# Patient Record
Sex: Female | Born: 1965 | Hispanic: Yes | Marital: Single | Smoking: Current some day smoker
Health system: Southern US, Community
[De-identification: ages and names within clinical notes are randomized; demographics above are authoritative.]

## PROBLEM LIST (undated history)

## (undated) DIAGNOSIS — J45909 Unspecified asthma, uncomplicated: Secondary | ICD-10-CM

## (undated) DIAGNOSIS — F319 Bipolar disorder, unspecified: Secondary | ICD-10-CM

## (undated) DIAGNOSIS — I10 Essential (primary) hypertension: Secondary | ICD-10-CM

## (undated) HISTORY — PX: BREAST EXCISIONAL BIOPSY: SUR124

---

## 2015-03-11 ENCOUNTER — Emergency Department
Admission: EM | Admit: 2015-03-11 | Discharge: 2015-03-11 | Disposition: A | Discharge status: Home / Self Care | Payer: Medicaid Other | Attending: Emergency Medicine | Admitting: Emergency Medicine

## 2015-03-11 ENCOUNTER — Encounter: Payer: Self-pay | Admitting: Emergency Medicine

## 2015-03-11 DIAGNOSIS — I10 Essential (primary) hypertension: Secondary | ICD-10-CM | POA: Insufficient documentation

## 2015-03-11 DIAGNOSIS — H6092 Unspecified otitis externa, left ear: Secondary | ICD-10-CM | POA: Diagnosis not present

## 2015-03-11 DIAGNOSIS — F17200 Nicotine dependence, unspecified, uncomplicated: Secondary | ICD-10-CM | POA: Diagnosis not present

## 2015-03-11 DIAGNOSIS — E119 Type 2 diabetes mellitus without complications: Secondary | ICD-10-CM | POA: Diagnosis not present

## 2015-03-11 DIAGNOSIS — J0100 Acute maxillary sinusitis, unspecified: Secondary | ICD-10-CM | POA: Diagnosis not present

## 2015-03-11 DIAGNOSIS — R51 Headache: Secondary | ICD-10-CM | POA: Diagnosis present

## 2015-03-11 DIAGNOSIS — H65192 Other acute nonsuppurative otitis media, left ear: Secondary | ICD-10-CM

## 2015-03-11 DIAGNOSIS — H65112 Acute and subacute allergic otitis media (mucoid) (sanguinous) (serous), left ear: Secondary | ICD-10-CM

## 2015-03-11 HISTORY — DX: Bipolar disorder, unspecified: F31.9

## 2015-03-11 HISTORY — DX: Unspecified asthma, uncomplicated: J45.909

## 2015-03-11 HISTORY — DX: Essential (primary) hypertension: I10

## 2015-03-11 MED ORDER — NEOMYCIN-POLYMYXIN-HC 3.5-10000-1 OT SOLN: 3.0000 [drp] | Freq: Three times a day (TID) | OTIC | Status: AC

## 2015-03-11 MED ORDER — CETIRIZINE HCL 10 MG PO TABS: 10.0000 mg | ORAL_TABLET | Freq: Every day | ORAL | Status: DC

## 2015-03-11 MED ORDER — FLUTICASONE PROPIONATE 50 MCG/ACT NA SUSP: 1.0000 | Freq: Two times a day (BID) | NASAL | Status: DC

## 2015-03-11 MED ORDER — AMOXICILLIN-POT CLAVULANATE 875-125 MG PO TABS: 1.0000 | ORAL_TABLET | Freq: Two times a day (BID) | ORAL | Status: DC

## 2015-03-11 NOTE — Discharge Instructions (Signed)
Ear Drops, Adult °You have been diagnosed with a condition requiring you to put drops of medicine into your outer ear. °HOME CARE INSTRUCTIONS  °· Put drops in the affected ear as instructed. After putting the drops in, you will need to lie down with the affected ear facing up for ten minutes so the drops will remain in the ear canal and run down and fill the canal. Continue using the ear drops for as long as directed by your health care provider. °· Prior to getting up, put a cotton ball gently in your ear canal. Leave enough of the cotton ball out so it can be easily removed. Do not attempt to push this down into the canal with a cotton-tipped swab or other instrument. °· Do not irrigate or wash out your ears if you have had a perforated eardrum or mastoid surgery, or unless instructed to do so by your health care provider. °· Keep appointments with your health care provider as instructed. °· Finish all medicine, or use for the length of time prescribed by your health care provider. Continue the drops even if your problem seems to be doing well after a couple days, or continue as instructed. °SEEK MEDICAL CARE IF: °· You become worse or develop increasing pain. °· You notice any unusual drainage from your ear (particularly if the drainage has a bad smell). °· You develop hearing difficulties. °· You experience a serious form of dizziness in which you feel as if the room is spinning, and you feel nauseated (vertigo). °· The outside of your ear becomes red or swollen or both. This may be a sign of an allergic reaction. °MAKE SURE YOU:  °· Understand these instructions. °· Will watch your condition. °· Will get help right away if you are not doing well or get worse. °  °This information is not intended to replace advice given to you by your health care provider. Make sure you discuss any questions you have with your health care provider. °  °Document Released: 01/31/2001 Document Revised: 02/27/2014 Document  Reviewed: 09/03/2012 °Elsevier Interactive Patient Education ©2016 Elsevier Inc. ° °Otitis Externa °Otitis externa is a bacterial or fungal infection of the outer ear canal. This is the area from the eardrum to the outside of the ear. Otitis externa is sometimes called "swimmer's ear." °CAUSES  °Possible causes of infection include: °· Swimming in dirty water. °· Moisture remaining in the ear after swimming or bathing. °· Mild injury (trauma) to the ear. °· Objects stuck in the ear (foreign body). °· Cuts or scrapes (abrasions) on the outside of the ear. °SIGNS AND SYMPTOMS  °The first symptom of infection is often itching in the ear canal. Later signs and symptoms may include swelling and redness of the ear canal, ear pain, and yellowish-white fluid (pus) coming from the ear. The ear pain may be worse when pulling on the earlobe. °DIAGNOSIS  °Your health care provider will perform a physical exam. A sample of fluid may be taken from the ear and examined for bacteria or fungi. °TREATMENT  °Antibiotic ear drops are often given for 10 to 14 days. Treatment may also include pain medicine or corticosteroids to reduce itching and swelling. °HOME CARE INSTRUCTIONS  °· Apply antibiotic ear drops to the ear canal as prescribed by your health care provider. °· Take medicines only as directed by your health care provider. °· If you have diabetes, follow any additional treatment instructions from your health care provider. °· Keep all follow-up visits   as directed by your health care provider. PREVENTION   Keep your ear dry. Use the corner of a towel to absorb water out of the ear canal after swimming or bathing.  Avoid scratching or putting objects inside your ear. This can damage the ear canal or remove the protective wax that lines the canal. This makes it easier for bacteria and fungi to grow.  Avoid swimming in lakes, polluted water, or poorly chlorinated pools.  You may use ear drops made of rubbing alcohol and  vinegar after swimming. Combine equal parts of white vinegar and alcohol in a bottle. Put 3 or 4 drops into each ear after swimming. SEEK MEDICAL CARE IF:   You have a fever.  Your ear is still red, swollen, painful, or draining pus after 3 days.  Your redness, swelling, or pain gets worse.  You have a severe headache.  You have redness, swelling, pain, or tenderness in the area behind your ear. MAKE SURE YOU:   Understand these instructions.  Will watch your condition.  Will get help right away if you are not doing well or get worse.   This information is not intended to replace advice given to you by your health care provider. Make sure you discuss any questions you have with your health care provider.   Document Released: 02/06/2005 Document Revised: 02/27/2014 Document Reviewed: 02/23/2011 Elsevier Interactive Patient Education 2016 [employer].  Otitis Media, Adult Otitis media is redness, soreness, and inflammation of the middle ear. Otitis media may be caused by allergies or, most commonly, by infection. Often it occurs as a complication of the common cold. SIGNS AND SYMPTOMS Symptoms of otitis media may include:  Earache.  Fever.  Ringing in your ear.  Headache.  Leakage of fluid from the ear. DIAGNOSIS To diagnose otitis media, your health care provider will examine your ear with an otoscope. This is an instrument that allows your health care provider to see into your ear in order to examine your eardrum. Your health care provider also will ask you questions about your symptoms. TREATMENT  Typically, otitis media resolves on its own within 3-5 days. Your health care provider may prescribe medicine to ease your symptoms of pain. If otitis media does not resolve within 5 days or is recurrent, your health care provider may prescribe antibiotic medicines if he or she suspects that a bacterial infection is the cause. HOME CARE INSTRUCTIONS   If you were prescribed  an antibiotic medicine, finish it all even if you start to feel better.  Take medicines only as directed by your health care provider.  Keep all follow-up visits as directed by your health care provider. SEEK MEDICAL CARE IF:  You have otitis media only in one ear, or bleeding from your nose, or both.  You notice a lump on your neck.  You are not getting better in 3-5 days.  You feel worse instead of better. SEEK IMMEDIATE MEDICAL CARE IF:   You have pain that is not controlled with medicine.  You have swelling, redness, or pain around your ear or stiffness in your neck.  You notice that part of your face is paralyzed.  You notice that the bone behind your ear (mastoid) is tender when you touch it. MAKE SURE YOU:   Understand these instructions.  Will watch your condition.  Will get help right away if you are not doing well or get worse.   This information is not intended to replace advice given to you  by your health care provider. Make sure you discuss any questions you have with your health care provider.   Document Released: 11/12/2003 Document Revised: 02/27/2014 Document Reviewed: 09/03/2012 Elsevier Interactive Patient Education 2016 Elsevier Inc.  Sinusitis, Adult Sinusitis is redness, soreness, and inflammation of the paranasal sinuses. Paranasal sinuses are air pockets within the bones of your face. They are located beneath your eyes, in the middle of your forehead, and above your eyes. In healthy paranasal sinuses, mucus is able to drain out, and air is able to circulate through them by way of your nose. However, when your paranasal sinuses are inflamed, mucus and air can become trapped. This can allow bacteria and other germs to grow and cause infection. Sinusitis can develop quickly and last only a short time (acute) or continue over a long period (chronic). Sinusitis that lasts for more than 12 weeks is considered chronic. CAUSES Causes of sinusitis  include:  Allergies.  Structural abnormalities, such as displacement of the cartilage that separates your nostrils (deviated septum), which can decrease the air flow through your nose and sinuses and affect sinus drainage.  Functional abnormalities, such as when the small hairs (cilia) that line your sinuses and help remove mucus do not work properly or are not present. SIGNS AND SYMPTOMS Symptoms of acute and chronic sinusitis are the same. The primary symptoms are pain and pressure around the affected sinuses. Other symptoms include:  Upper toothache.  Earache.  Headache.  Bad breath.  Decreased sense of smell and taste.  A cough, which worsens when you are lying flat.  Fatigue.  Fever.  Thick drainage from your nose, which often is green and may contain pus (purulent).  Swelling and warmth over the affected sinuses. DIAGNOSIS Your health care provider will perform a physical exam. During your exam, your health care provider may perform any of the following to help determine if you have acute sinusitis or chronic sinusitis:  Look in your nose for signs of abnormal growths in your nostrils (nasal polyps).  Tap over the affected sinus to check for signs of infection.  View the inside of your sinuses using an imaging device that has a light attached (endoscope). If your health care provider suspects that you have chronic sinusitis, one or more of the following tests may be recommended:  Allergy tests.  Nasal culture. A sample of mucus is taken from your nose, sent to a lab, and screened for bacteria.  Nasal cytology. A sample of mucus is taken from your nose and examined by your health care provider to determine if your sinusitis is related to an allergy. TREATMENT Most cases of acute sinusitis are related to a viral infection and will resolve on their own within 10 days. Sometimes, medicines are prescribed to help relieve symptoms of both acute and chronic sinusitis.  These may include pain medicines, decongestants, nasal steroid sprays, or saline sprays. However, for sinusitis related to a bacterial infection, your health care provider will prescribe antibiotic medicines. These are medicines that will help kill the bacteria causing the infection. Rarely, sinusitis is caused by a fungal infection. In these cases, your health care provider will prescribe antifungal medicine. For some cases of chronic sinusitis, surgery is needed. Generally, these are cases in which sinusitis recurs more than 3 times per year, despite other treatments. HOME CARE INSTRUCTIONS  Drink plenty of water. Water helps thin the mucus so your sinuses can drain more easily.  Use a humidifier.  Inhale steam 3-4 times a day (  for example, sit in the bathroom with the shower running).  Apply a warm, moist washcloth to your face 3-4 times a day, or as directed by your health care provider.  Use saline nasal sprays to help moisten and clean your sinuses.  Take medicines only as directed by your health care provider.  If you were prescribed either an antibiotic or antifungal medicine, finish it all even if you start to feel better. SEEK IMMEDIATE MEDICAL CARE IF:  You have increasing pain or severe headaches.  You have nausea, vomiting, or drowsiness.  You have swelling around your face.  You have vision problems.  You have a stiff neck.  You have difficulty breathing.   This information is not intended to replace advice given to you by your health care provider. Make sure you discuss any questions you have with your health care provider.   Document Released: 02/06/2005 Document Revised: 02/27/2014 Document Reviewed: 02/21/2011 Elsevier Interactive Patient Education [employer].

## 2015-03-11 NOTE — ED Notes (Signed)
States she noticed some left ear pain/drainage  And sinus pressure   For about 1 week ago

## 2015-03-11 NOTE — ED Provider Notes (Signed)
[facility] Emergency Department Provider Note  ____________________________________________  Time seen: Approximately 12:09 PM  I have reviewed the triage vital signs and the nursing notes.   HISTORY  Chief Complaint Facial Pain    HPI Jo Johnston is a 50 y.o. female who presents to the emergency department complaining of left ear pain, sinus pressure and congestion times one week. Patient states that she has pain when she pushes on the outside of her ear as well as "deep ear pain." She also endorses nasal congestion with sinus pressure over her maxillary sinuses. She denies any fevers or chills, difficulty breathing or swallowing, chest pain, shortness of breath, abdominal pain, nausea or vomiting. Patient has used hydroperoxide for her ear but has not taken any other medications.   Past Medical History  Diagnosis Date  . Hypertension   . Diabetes mellitus without complication ([place])   . Asthma   . Bipolar 1 disorder ([place])     There are no active problems to display for this patient.   History reviewed. No pertinent past surgical history.  Current Outpatient Rx  Name  Route  Sig  Dispense  Refill  . amoxicillin-clavulanate (AUGMENTIN) 875-125 MG tablet   Oral   Take 1 tablet by mouth 2 (two) times daily.   14 tablet   0   . cetirizine (ZYRTEC) 10 MG tablet   Oral   Take 1 tablet (10 mg total) by mouth daily.   30 tablet   0   . fluticasone (FLONASE) 50 MCG/ACT nasal spray   Each Nare   Place 1 spray into both nostrils 2 (two) times daily.   16 g   0   . neomycin-polymyxin-hydrocortisone (CORTISPORIN) otic solution   Left Ear   Place 3 drops into the left ear 3 (three) times daily.   10 mL   0     Allergies Review of patient's allergies indicates no known allergies.  No family history on file.  Social History Social History  Substance Use Topics  . Smoking status: Current Some Day Smoker  . Smokeless tobacco: None   . Alcohol Use: No     Review of Systems  Constitutional: No fever/chills Eyes: No visual changes. No discharge ENT: No sore throat. Also for nasal congestion and sinus pressure. Positive for left ear pain. Cardiovascular: no chest pain. Respiratory: no cough. No SOB. Gastrointestinal: No abdominal pain.  No nausea, no vomiting.  No diarrhea.  No constipation. Genitourinary: Negative for dysuria. No hematuria Musculoskeletal: Negative for back pain. Skin: Negative for rash. Neurological: Negative for headaches, focal weakness or numbness. 10-point ROS otherwise negative.  ____________________________________________   PHYSICAL EXAM:  VITAL SIGNS: ED Triage Vitals  Enc Vitals Group     BP 03/11/15 1143 112/45 mmHg     Pulse Rate 03/11/15 1143 69     Resp 03/11/15 1143 20     Temp 03/11/15 1143 98 F (36.7 C)     Temp Source 03/11/15 1143 Oral     SpO2 03/11/15 1143 96 %     Weight 03/11/15 1143 100 lb (45.36 kg)     Height 03/11/15 1143 4\' 8"  (1.422 m)     Head Cir --      Peak Flow --      Pain Score 03/11/15 1141 6     Pain Loc --      Pain Edu? --      Excl. in [place]? --      Constitutional: Alert  and oriented. Well appearing and in no acute distress. Eyes: Conjunctivae are normal. PERRL. EOMI. Head: Atraumatic. ENT:      Ears: EAC and TM on the right side is unremarkable. EAC is erythematous and edematous on the left side. TM is dusky in appearance, with mucoid air-fluid level and bulging of the TM.      Nose: Moderate purulent congestion/rhinnorhea. Tenderness to percussion over the maxillary sinuses bilaterally.      Mouth/Throat: Mucous membranes are moist.  Neck: No stridor.   Hematological/Lymphatic/Immunilogical: No cervical lymphadenopathy. Cardiovascular: Normal rate, regular rhythm. Normal S1 and S2.  Good peripheral circulation. Respiratory: Normal respiratory effort without tachypnea or retractions. Lungs CTAB. Gastrointestinal: Soft and nontender. No  distention. No CVA tenderness. Musculoskeletal: No lower extremity tenderness nor edema.  No joint effusions. Neurologic:  Normal speech and language. No gross focal neurologic deficits are appreciated.  Skin:  Skin is warm, dry and intact. No rash noted. Psychiatric: Mood and affect are normal. Speech and behavior are normal. Patient exhibits appropriate insight and judgement.   ____________________________________________   LABS (all labs ordered are listed, but only abnormal results are displayed)  Labs Reviewed - No data to display ____________________________________________  EKG   ____________________________________________  RADIOLOGY   No results found.  ____________________________________________    PROCEDURES  Procedure(s) performed:       Medications - No data to display   ____________________________________________   INITIAL IMPRESSION / ASSESSMENT AND PLAN / ED COURSE  Pertinent labs & imaging results that were available during my care of the patient were reviewed by me and considered in my medical decision making (see chart for details).  Patient's diagnosis is consistent with acute bacterial maxillary sinusitis with left ear otitis media and otitis externa.. Patient will be discharged home with prescriptions for oral antibiotics, antibiotic eardrops, Flonase and Zyrtec. Patient is to follow up with [facility] care provider if symptoms persist past this treatment course. Patient is given ED precautions to return to the ED for any worsening or new symptoms.     ____________________________________________  FINAL CLINICAL IMPRESSION(S) / ED DIAGNOSES  Final diagnoses:  Acute maxillary sinusitis, recurrence not specified  Acute mucoid otitis media of left ear  Otitis externa, left      NEW MEDICATIONS STARTED DURING THIS VISIT:  New Prescriptions   AMOXICILLIN-CLAVULANATE (AUGMENTIN) 875-125 MG TABLET    Take 1 tablet by mouth 2 (two) times  daily.   CETIRIZINE (ZYRTEC) 10 MG TABLET    Take 1 tablet (10 mg total) by mouth daily.   FLUTICASONE (FLONASE) 50 MCG/ACT NASAL SPRAY    Place 1 spray into both nostrils 2 (two) times daily.   NEOMYCIN-POLYMYXIN-HYDROCORTISONE (CORTISPORIN) OTIC SOLUTION    Place 3 drops into the left ear 3 (three) times daily.        Charline Bills Cuthriell, PA-C 03/11/15 1236  Eula Listen, MD 03/11/15 [phone]

## 2015-03-11 NOTE — ED Notes (Signed)
Pt discharged home after verbalizing understanding of discharge instructions; nad noted. 

## 2015-03-31 ENCOUNTER — Emergency Department: Payer: Medicaid Other

## 2015-03-31 ENCOUNTER — Emergency Department
Admission: EM | Admit: 2015-03-31 | Discharge: 2015-03-31 | Disposition: A | Discharge status: Home / Self Care | Payer: Medicaid Other | Attending: Emergency Medicine | Admitting: Emergency Medicine

## 2015-03-31 ENCOUNTER — Encounter: Payer: Self-pay | Admitting: Emergency Medicine

## 2015-03-31 DIAGNOSIS — R51 Headache: Secondary | ICD-10-CM | POA: Diagnosis present

## 2015-03-31 DIAGNOSIS — F17200 Nicotine dependence, unspecified, uncomplicated: Secondary | ICD-10-CM | POA: Diagnosis not present

## 2015-03-31 DIAGNOSIS — E119 Type 2 diabetes mellitus without complications: Secondary | ICD-10-CM | POA: Insufficient documentation

## 2015-03-31 DIAGNOSIS — J0120 Acute ethmoidal sinusitis, unspecified: Secondary | ICD-10-CM | POA: Insufficient documentation

## 2015-03-31 DIAGNOSIS — Z79899 Other long term (current) drug therapy: Secondary | ICD-10-CM | POA: Insufficient documentation

## 2015-03-31 DIAGNOSIS — J320 Chronic maxillary sinusitis: Secondary | ICD-10-CM | POA: Diagnosis not present

## 2015-03-31 DIAGNOSIS — Z7951 Long term (current) use of inhaled steroids: Secondary | ICD-10-CM | POA: Insufficient documentation

## 2015-03-31 DIAGNOSIS — H1132 Conjunctival hemorrhage, left eye: Secondary | ICD-10-CM | POA: Insufficient documentation

## 2015-03-31 DIAGNOSIS — I10 Essential (primary) hypertension: Secondary | ICD-10-CM | POA: Insufficient documentation

## 2015-03-31 MED ORDER — OXYCODONE-ACETAMINOPHEN 5-325 MG PO TABS: 1.0000 | ORAL_TABLET | ORAL | Status: DC | PRN

## 2015-03-31 MED ORDER — LEVOFLOXACIN 500 MG PO TABS: 500.0000 mg | ORAL_TABLET | Freq: Every day | ORAL | Status: AC

## 2015-03-31 MED ORDER — NAPROXEN 500 MG PO TABS: 500.0000 mg | ORAL_TABLET | Freq: Two times a day (BID) | ORAL | Status: DC

## 2015-03-31 MED ORDER — GUAIFENESIN ER 600 MG PO TB12: 600.0000 mg | ORAL_TABLET | Freq: Two times a day (BID) | ORAL | Status: DC

## 2015-03-31 NOTE — ED Provider Notes (Signed)
[facility] Emergency Department Provider Note  ____________________________________________  Time seen: Approximately 10:38 AM  I have reviewed the triage vital signs and the nursing notes.   HISTORY  Chief Complaint Facial Pain    HPI Jo Johnston is a 50 y.o. female is for evaluation of head sinus pressure and ear pain and headaches 3 weeks. Patient was seen here on March 11, 2015 for left ear pain. His pressure and congestion. Extreme pressure over maxillary sinuses. She was treated with antibiotics and eardrops and states that her headache is just as bad and continuous. States it's not the worst headache of her life it's just wanted his last a long time. Asked medical history significant for hypertension diabetes and asthma.   Past Medical History  Diagnosis Date  . Hypertension   . Diabetes mellitus without complication ([place])   . Asthma   . Bipolar 1 disorder ([place])     There are no active problems to display for this patient.   History reviewed. No pertinent past surgical history.  Current Outpatient Rx  Name  Route  Sig  Dispense  Refill  . cetirizine (ZYRTEC) 10 MG tablet   Oral   Take 1 tablet (10 mg total) by mouth daily.   30 tablet   0   . fluticasone (FLONASE) 50 MCG/ACT nasal spray   Each Nare   Place 1 spray into both nostrils 2 (two) times daily.   16 g   0   . guaiFENesin (MUCINEX) 600 MG 12 hr tablet   Oral   Take 1 tablet (600 mg total) by mouth 2 (two) times daily.   60 tablet   2   . levofloxacin (LEVAQUIN) 500 MG tablet   Oral   Take 1 tablet (500 mg total) by mouth daily.   14 tablet   0   . naproxen (NAPROSYN) 500 MG tablet   Oral   Take 1 tablet (500 mg total) by mouth 2 (two) times daily with a meal.   60 tablet   0   . oxyCODONE-acetaminophen (ROXICET) 5-325 MG tablet   Oral   Take 1-2 tablets by mouth every 4 (four) hours as needed for severe pain.   15 tablet   0      Allergies Review of patient's allergies indicates no known allergies.  No family history on file.  Social History Social History  Substance Use Topics  . Smoking status: Current Some Day Smoker  . Smokeless tobacco: None  . Alcohol Use: No    Review of Systems Constitutional: No fever/chills Eyes: No visual changes. Positive for ruptured blood vessel in left eye. ENT: No sore throat. Cardiovascular: Denies chest pain. Respiratory: Denies shortness of breath. Gastrointestinal: No abdominal pain.  No nausea, no vomiting.  No diarrhea.  No constipation. Genitourinary: Negative for dysuria. Musculoskeletal: Negative for back pain. Skin: Negative for rash. Neurological: Positive for headaches, negative for focal weakness or numbness.  10-point ROS otherwise negative.  ____________________________________________   PHYSICAL EXAM:  VITAL SIGNS: ED Triage Vitals  Enc Vitals Group     BP 03/31/15 1006 145/85 mmHg     Pulse Rate 03/31/15 1006 57     Resp 03/31/15 1006 20     Temp 03/31/15 1006 98 F (36.7 C)     Temp Source 03/31/15 1006 Oral     SpO2 03/31/15 1006 98 %     Weight 03/31/15 1006 100 lb (45.36 kg)     Height 03/31/15 1006 4'  8" (1.422 m)     Head Cir --      Peak Flow --      Pain Score 03/31/15 1005 6     Pain Loc --      Pain Edu? --      Excl. in [place]? --     Constitutional: Alert and oriented. Well appearing and in no acute distress. Eyes: Conjunctivae are normal. PERRL. EOMI. subconjunctival hemorrhage noted inferiorly at 6:00 on the left eye. Head: Atraumatic. Both frontal and maxillary tenderness noted bilaterally Nose: No congestion/rhinnorhea. Turbinates swollen bilaterally. Mouth/Throat: Mucous membranes are moist.  Oropharynx non-erythematous. Neck: No stridor.  No adenopathy noted. Cardiovascular: Normal rate, regular rhythm. Grossly normal heart sounds.  Good peripheral circulation. Respiratory: Normal respiratory effort.  No  retractions. Lungs CTAB. Musculoskeletal: No lower extremity tenderness nor edema.  No joint effusions. Neurologic:  Normal speech and language. No gross focal neurologic deficits are appreciated. No gait instability. Skin:  Skin is warm, dry and intact. No rash noted. Psychiatric: Mood and affect are normal. Speech and behavior are normal.  ____________________________________________   LABS (all labs ordered are listed, but only abnormal results are displayed)  Labs Reviewed - No data to display ____________________________________________  RADIOLOGY:  There is mild bilateral maxillary sinus mucosal thickening. There is a small air-fluid level in the right maxillary sinus. There is mild bilateral ethmoid sinus mucosal thickening. The visualized portions of the mastoid sinuses are well aerated.  IMPRESSION: 1. No acute intracranial pathology. 2. No acute osseous injury of the maxillofacial bones   PROCEDURES  Procedure(s) performed: None  Critical Care performed: No  ____________________________________________   INITIAL IMPRESSION / ASSESSMENT AND PLAN / ED COURSE  Pertinent labs & imaging results that were available during my care of the patient were reviewed by me and considered in my medical decision making (see chart for details).  Right maxillary and ethmoid sinusitis. Rx given for guaifenesin600 milligrams twice a day,, Levaquin 500mg  twice a day. Patient follow-up with her PCP or return to the ER with any worsening symptomology. Continue Tylenol over-the-counter as needed for headache in addition we'll prescribe Naprosyn and 500 mg to times a day. Patient voices no other emergency medical complaints at this time ____________________________________________   FINAL CLINICAL IMPRESSION(S) / ED DIAGNOSES  Final diagnoses:  Left maxillary sinusitis  Acute ethmoidal sinusitis, recurrence not specified     This chart was dictated using voice recognition  software/Dragon. Despite best efforts to proofread, errors can occur which can change the meaning. Any change was purely unintentional.   Arlyss Repress, PA-C 03/31/15 [place], MD 03/31/15 1440

## 2015-03-31 NOTE — ED Notes (Signed)
Pt presents with left sided facial pain and left side ear pain. Was seen here for same back in January, pt states she did not finish all of her antiobiotics because she started feeling better.

## 2015-03-31 NOTE — ED Notes (Signed)
States she was sen about 3 weeks ago. Had sinus pressure and left ear pain  Was placed on augmentin now having increased pain to left side of head  Describes pain as "pressure"

## 2015-03-31 NOTE — ED Notes (Signed)
Patient transported to CT 

## 2015-03-31 NOTE — Discharge Instructions (Signed)

## 2015-04-19 ENCOUNTER — Other Ambulatory Visit: Payer: Self-pay | Admitting: Family Medicine

## 2015-04-19 DIAGNOSIS — M545 Low back pain: Secondary | ICD-10-CM

## 2015-04-19 DIAGNOSIS — G8929 Other chronic pain: Secondary | ICD-10-CM

## 2015-04-29 ENCOUNTER — Ambulatory Visit: Payer: Medicaid Other | Admitting: Anesthesiology

## 2015-05-07 ENCOUNTER — Ambulatory Visit: Payer: Medicaid Other

## 2015-05-09 ENCOUNTER — Emergency Department: Payer: Medicaid Other

## 2015-05-09 ENCOUNTER — Emergency Department
Admission: EM | Admit: 2015-05-09 | Discharge: 2015-05-09 | Disposition: A | Discharge status: Home / Self Care | Payer: Medicaid Other | Attending: Emergency Medicine | Admitting: Emergency Medicine

## 2015-05-09 ENCOUNTER — Encounter: Payer: Self-pay | Admitting: Emergency Medicine

## 2015-05-09 DIAGNOSIS — Z7951 Long term (current) use of inhaled steroids: Secondary | ICD-10-CM | POA: Insufficient documentation

## 2015-05-09 DIAGNOSIS — E119 Type 2 diabetes mellitus without complications: Secondary | ICD-10-CM | POA: Insufficient documentation

## 2015-05-09 DIAGNOSIS — I10 Essential (primary) hypertension: Secondary | ICD-10-CM | POA: Insufficient documentation

## 2015-05-09 DIAGNOSIS — Z791 Long term (current) use of non-steroidal anti-inflammatories (NSAID): Secondary | ICD-10-CM | POA: Diagnosis not present

## 2015-05-09 DIAGNOSIS — R079 Chest pain, unspecified: Secondary | ICD-10-CM | POA: Insufficient documentation

## 2015-05-09 DIAGNOSIS — F17200 Nicotine dependence, unspecified, uncomplicated: Secondary | ICD-10-CM | POA: Insufficient documentation

## 2015-05-09 DIAGNOSIS — J209 Acute bronchitis, unspecified: Secondary | ICD-10-CM | POA: Diagnosis not present

## 2015-05-09 DIAGNOSIS — Z79899 Other long term (current) drug therapy: Secondary | ICD-10-CM | POA: Diagnosis not present

## 2015-05-09 DIAGNOSIS — J40 Bronchitis, not specified as acute or chronic: Secondary | ICD-10-CM

## 2015-05-09 DIAGNOSIS — M25512 Pain in left shoulder: Secondary | ICD-10-CM | POA: Diagnosis not present

## 2015-05-09 LAB — COMPREHENSIVE METABOLIC PANEL
ALBUMIN: 3.7 g/dL (ref 3.5–5.0)
ALT: 16 U/L (ref 14–54)
AST: 30 U/L (ref 15–41)
Alkaline Phosphatase: 70 U/L (ref 38–126)
Anion gap: 9 (ref 5–15)
BUN: 25 mg/dL — AB (ref 6–20)
CHLORIDE: 105 mmol/L (ref 101–111)
CO2: 21 mmol/L — AB (ref 22–32)
CREATININE: 1.1 mg/dL — AB (ref 0.44–1.00)
Calcium: 8.4 mg/dL — ABNORMAL LOW (ref 8.9–10.3)
GFR calc Af Amer: >60 mL/min (ref >60)
GFR, EST NON AFRICAN AMERICAN: 58 mL/min — AB (ref >60)
GLUCOSE: 122 mg/dL — AB (ref 65–99)
Potassium: 3.4 mmol/L — ABNORMAL LOW (ref 3.5–5.1)
SODIUM: 135 mmol/L (ref 135–145)
Total Bilirubin: 0.5 mg/dL (ref 0.3–1.2)
Total Protein: 6.6 g/dL (ref 6.5–8.1)

## 2015-05-09 LAB — TROPONIN I
Troponin I: <0.03 ng/mL (ref <0.031)
Troponin I: <0.03 ng/mL (ref <0.031)

## 2015-05-09 LAB — CBC
HCT: 33.9 % — ABNORMAL LOW (ref 35.0–47.0)
Hemoglobin: 11.1 g/dL — ABNORMAL LOW (ref 12.0–16.0)
MCH: 27.4 pg (ref 26.0–34.0)
MCHC: 32.7 g/dL (ref 32.0–36.0)
MCV: 83.9 fL (ref 80.0–100.0)
PLATELETS: 195 10*3/uL (ref 150–440)
RBC: 4.04 MIL/uL (ref 3.80–5.20)
RDW: 14.3 % (ref 11.5–14.5)
WBC: 19.3 10*3/uL — AB (ref 3.6–11.0)

## 2015-05-09 LAB — FIBRIN DERIVATIVES D-DIMER (ARMC ONLY): Fibrin derivatives D-dimer (ARMC): 228 (ref 0–499)

## 2015-05-09 MED ORDER — AZITHROMYCIN 250 MG PO TABS: ORAL_TABLET | ORAL | Status: AC

## 2015-05-09 MED ORDER — SODIUM CHLORIDE 0.9 % IV BOLUS (SEPSIS)
1000.0000 mL | Freq: Once | INTRAVENOUS | Status: DC
Start: 2015-05-09 — End: 2015-05-09

## 2015-05-09 MED ORDER — ASPIRIN 81 MG PO CHEW
324.0000 mg | CHEWABLE_TABLET | Freq: Once | ORAL | Status: AC
  Administered 2015-05-09: 324 mg via ORAL

## 2015-05-09 MED ORDER — ALBUTEROL SULFATE (2.5 MG/3ML) 0.083% IN NEBU
5.0000 mg | INHALATION_SOLUTION | Freq: Once | RESPIRATORY_TRACT | Status: AC
Start: 2015-05-09 — End: 2015-05-09
  Administered 2015-05-09: 5 mg via RESPIRATORY_TRACT
  Filled 2015-05-09: qty 6

## 2015-05-09 MED ORDER — NITROGLYCERIN 0.4 MG SL SUBL
0.4000 mg | SUBLINGUAL_TABLET | SUBLINGUAL | Status: DC | PRN
  Administered 2015-05-09 (×2): 0.4 mg via SUBLINGUAL
  Filled 2015-05-09: qty 1

## 2015-05-09 MED ORDER — MELOXICAM 15 MG PO TABS: 15.0000 mg | ORAL_TABLET | Freq: Every day | ORAL | Status: DC

## 2015-05-09 MED ORDER — KETOROLAC TROMETHAMINE 30 MG/ML IJ SOLN
30.0000 mg | Freq: Once | INTRAMUSCULAR | Status: AC
  Administered 2015-05-09: 30 mg via INTRAVENOUS
  Filled 2015-05-09: qty 1

## 2015-05-09 MED ORDER — MORPHINE SULFATE (PF) 4 MG/ML IV SOLN
4.0000 mg | Freq: Once | INTRAVENOUS | Status: AC
  Administered 2015-05-09: 4 mg via INTRAVENOUS
  Filled 2015-05-09: qty 1

## 2015-05-09 MED ORDER — ASPIRIN 81 MG PO CHEW
CHEWABLE_TABLET | ORAL | Status: AC
  Administered 2015-05-09: 324 mg via ORAL
  Filled 2015-05-09: qty 4

## 2015-05-09 MED ORDER — SODIUM CHLORIDE 0.9 % IV BOLUS (SEPSIS)
1000.0000 mL | Freq: Once | INTRAVENOUS | Status: AC
  Administered 2015-05-09: 1000 mL via INTRAVENOUS

## 2015-05-09 MED ORDER — DEXTROSE 5 % IV SOLN
500.0000 mg | Freq: Once | INTRAVENOUS | Status: AC
  Administered 2015-05-09: 500 mg via INTRAVENOUS
  Filled 2015-05-09: qty 500

## 2015-05-09 MED ORDER — NITROGLYCERIN 0.4 MG SL SUBL
SUBLINGUAL_TABLET | SUBLINGUAL | Status: AC
  Administered 2015-05-09: 0.4 mg via SUBLINGUAL
  Filled 2015-05-09: qty 1

## 2015-05-09 NOTE — Discharge Instructions (Signed)
Musculoskeletal Pain Musculoskeletal pain is muscle and boney aches and pains. These pains can occur in any part of the body. Your caregiver may treat you without knowing the cause of the pain. They may treat you if blood or urine tests, X-rays, and other tests were normal.  CAUSES There is often not a definite cause or reason for these pains. These pains may be caused by a type of germ (virus). The discomfort may also come from overuse. Overuse includes working out too hard when your body is not fit. Boney aches also come from weather changes. Bone is sensitive to atmospheric pressure changes. HOME CARE INSTRUCTIONS   Ask when your test results will be ready. Make sure you get your test results.  Only take over-the-counter or prescription medicines for pain, discomfort, or fever as directed by your caregiver. If you were given medications for your condition, do not drive, operate machinery or power tools, or sign legal documents for 24 hours. Do not drink alcohol. Do not take sleeping pills or other medications that may interfere with treatment.  Continue all activities unless the activities cause more pain. When the pain lessens, slowly resume normal activities. Gradually increase the intensity and duration of the activities or exercise.  During periods of severe pain, bed rest may be helpful. Lay or sit in any position that is comfortable.  Putting ice on the injured area.  Put ice in a bag.  Place a towel between your skin and the bag.  Leave the ice on for 15 to 20 minutes, 3 to 4 times a day.  Follow up with your caregiver for continued problems and no reason can be found for the pain. If the pain becomes worse or does not go away, it may be necessary to repeat tests or do additional testing. Your caregiver may need to look further for a possible cause. SEEK IMMEDIATE MEDICAL CARE IF:  You have pain that is getting worse and is not relieved by medications.  You develop chest pain  that is associated with shortness or breath, sweating, feeling sick to your stomach (nauseous), or throw up (vomit).  Your pain becomes localized to the abdomen.  You develop any new symptoms that seem different or that concern you. MAKE SURE YOU:   Understand these instructions.  Will watch your condition.  Will get help right away if you are not doing well or get worse.   This information is not intended to replace advice given to you by your health care provider. Make sure you discuss any questions you have with your health care provider.   Document Released: 02/06/2005 Document Revised: 05/01/2011 Document Reviewed: 10/11/2012 Elsevier Interactive Patient Education 2016 Elsevier Inc.  Nonspecific Chest Pain  Chest pain can be caused by many different conditions. There is always a chance that your pain could be related to something serious, such as a heart attack or a blood clot in your lungs. Chest pain can also be caused by conditions that are not life-threatening. If you have chest pain, it is very important to follow up with your health care provider. CAUSES  Chest pain can be caused by:  Heartburn.  Pneumonia or bronchitis.  Anxiety or stress.  Inflammation around your heart (pericarditis) or lung (pleuritis or pleurisy).  A blood clot in your lung.  A collapsed lung (pneumothorax). It can develop suddenly on its own (spontaneous pneumothorax) or from trauma to the chest.  Shingles infection (varicella-zoster virus).  Heart attack.  Damage to the bones,  muscles, and cartilage that make up your chest wall. This can include:  Bruised bones due to injury.  Strained muscles or cartilage due to frequent or repeated coughing or overwork.  Fracture to one or more ribs.  Sore cartilage due to inflammation (costochondritis). RISK FACTORS  Risk factors for chest pain may include:  Activities that increase your risk for trauma or injury to your chest.  Respiratory  infections or conditions that cause frequent coughing.  Medical conditions or overeating that can cause heartburn.  Heart disease or family history of heart disease.  Conditions or health behaviors that increase your risk of developing a blood clot.  Having had chicken pox (varicella zoster). SIGNS AND SYMPTOMS Chest pain can feel like:  Burning or tingling on the surface of your chest or deep in your chest.  Crushing, pressure, aching, or squeezing pain.  Dull or sharp pain that is worse when you move, cough, or take a deep breath.  Pain that is also felt in your back, neck, shoulder, or arm, or pain that spreads to any of these areas. Your chest pain may come and go, or it may stay constant. DIAGNOSIS Lab tests or other studies may be needed to find the cause of your pain. Your health care provider may have you take a test called an ambulatory ECG (electrocardiogram). An ECG records your heartbeat patterns at the time the test is performed. You may also have other tests, such as:  Transthoracic echocardiogram (TTE). During echocardiography, sound waves are used to create a picture of all of the heart structures and to look at how blood flows through your heart.  Transesophageal echocardiogram (TEE).This is a more advanced imaging test that obtains images from inside your body. It allows your health care provider to see your heart in finer detail.  Cardiac monitoring. This allows your health care provider to monitor your heart rate and rhythm in real time.  Holter monitor. This is a portable device that records your heartbeat and can help to diagnose abnormal heartbeats. It allows your health care provider to track your heart activity for several days, if needed.  Stress tests. These can be done through exercise or by taking medicine that makes your heart beat more quickly.  Blood tests.  Imaging tests. TREATMENT  Your treatment depends on what is causing your chest pain.  Treatment may include:  Medicines. These may include:  Acid blockers for heartburn.  Anti-inflammatory medicine.  Pain medicine for inflammatory conditions.  Antibiotic medicine, if an infection is present.  Medicines to dissolve blood clots.  Medicines to treat coronary artery disease.  Supportive care for conditions that do not require medicines. This may include:  Resting.  Applying heat or cold packs to injured areas.  Limiting activities until pain decreases. HOME CARE INSTRUCTIONS  If you were prescribed an antibiotic medicine, finish it all even if you start to feel better.  Avoid any activities that bring on chest pain.  Do not use any tobacco products, including cigarettes, chewing tobacco, or electronic cigarettes. If you need help quitting, ask your health care provider.  Do not drink alcohol.  Take medicines only as directed by your health care provider.  Keep all follow-up visits as directed by your health care provider. This is important. This includes any further testing if your chest pain does not go away.  If heartburn is the cause for your chest pain, you may be told to keep your head raised (elevated) while sleeping. This reduces the chance  that acid will go from your stomach into your esophagus.  Make lifestyle changes as directed by your health care provider. These may include:  Getting regular exercise. Ask your health care provider to suggest some activities that are safe for you.  Eating a heart-healthy diet. A registered dietitian can help you to learn healthy eating options.  Maintaining a healthy weight.  Managing diabetes, if necessary.  Reducing stress. SEEK MEDICAL CARE IF:  Your chest pain does not go away after treatment.  You have a rash with blisters on your chest.  You have a fever. SEEK IMMEDIATE MEDICAL CARE IF:   Your chest pain is worse.  You have an increasing cough, or you cough up blood.  You have severe  abdominal pain.  You have severe weakness.  You faint.  You have chills.  You have sudden, unexplained chest discomfort.  You have sudden, unexplained discomfort in your arms, back, neck, or jaw.  You have shortness of breath at any time.  You suddenly start to sweat, or your skin gets clammy.  You feel nauseous or you vomit.  You suddenly feel light-headed or dizzy.  Your heart begins to beat quickly, or it feels like it is skipping beats. These symptoms may represent a serious problem that is an emergency. Do not wait to see if the symptoms will go away. Get medical help right away. Call your local emergency services (911 in the [place] Do not drive yourself to the hospital.   This information is not intended to replace advice given to you by your health care provider. Make sure you discuss any questions you have with your health care provider.   Document Released: 11/16/2004 Document Revised: 02/27/2014 Document Reviewed: 09/12/2013 Elsevier Interactive Patient Education 2016 Elsevier Inc.  Shoulder Pain The shoulder is the joint that connects your arms to your body. The bones that form the shoulder joint include the upper arm bone (humerus), the shoulder blade (scapula), and the collarbone (clavicle). The top of the humerus is shaped like a ball and fits into a rather flat socket on the scapula (glenoid cavity). A combination of muscles and strong, fibrous tissues that connect muscles to bones (tendons) support your shoulder joint and hold the ball in the socket. Small, fluid-filled sacs (bursae) are located in different areas of the joint. They act as cushions between the bones and the overlying soft tissues and help reduce friction between the gliding tendons and the bone as you move your arm. Your shoulder joint allows a wide range of motion in your arm. This range of motion allows you to do things like scratch your back or throw a ball. However, this range of motion also makes  your shoulder more prone to pain from overuse and injury. Causes of shoulder pain can originate from both injury and overuse and usually can be grouped in the following four categories:  Redness, swelling, and pain (inflammation) of the tendon (tendinitis) or the bursae (bursitis).  Instability, such as a dislocation of the joint.  Inflammation of the joint (arthritis).  Broken bone (fracture). HOME CARE INSTRUCTIONS   Apply ice to the sore area.  Put ice in a plastic bag.  Place a towel between your skin and the bag.  Leave the ice on for 15-20 minutes, 3-4 times per day for the first 2 days, or as directed by your health care provider.  Stop using cold packs if they do not help with the pain.  If you have a shoulder sling  or immobilizer, wear it as long as your caregiver instructs. Only remove it to shower or bathe. Move your arm as little as possible, but keep your hand moving to prevent swelling.  Squeeze a soft ball or foam pad as much as possible to help prevent swelling.  Only take over-the-counter or prescription medicines for pain, discomfort, or fever as directed by your caregiver. SEEK MEDICAL CARE IF:   Your shoulder pain increases, or new pain develops in your arm, hand, or fingers.  Your hand or fingers become cold and numb.  Your pain is not relieved with medicines. SEEK IMMEDIATE MEDICAL CARE IF:   Your arm, hand, or fingers are numb or tingling.  Your arm, hand, or fingers are significantly swollen or turn white or blue. MAKE SURE YOU:   Understand these instructions.  Will watch your condition.  Will get help right away if you are not doing well or get worse.   This information is not intended to replace advice given to you by your health care provider. Make sure you discuss any questions you have with your health care provider.   Document Released: 11/16/2004 Document Revised: 02/27/2014 Document Reviewed: 06/01/2014 Elsevier Interactive Patient  Education 2016 Elsevier Inc.  Upper Respiratory Infection, Adult Most upper respiratory infections (URIs) are caused by a virus. A URI affects the nose, throat, and upper air passages. The most common type of URI is often called "the common cold." HOME CARE   Take medicines only as told by your doctor.  Gargle warm saltwater or take cough drops to comfort your throat as told by your doctor.  Use a warm mist humidifier or inhale steam from a shower to increase air moisture. This may make it easier to breathe.  Drink enough fluid to keep your pee (urine) clear or pale yellow.  Eat soups and other clear broths.  Have a healthy diet.  Rest as needed.  Go back to work when your fever is gone or your doctor says it is okay.  You may need to stay home longer to avoid giving your URI to others.  You can also wear a face mask and wash your hands often to prevent spread of the virus.  Use your inhaler more if you have asthma.  Do not use any tobacco products, including cigarettes, chewing tobacco, or electronic cigarettes. If you need help quitting, ask your doctor. GET HELP IF:  You are getting worse, not better.  Your symptoms are not helped by medicine.  You have chills.  You are getting more short of breath.  You have brown or red mucus.  You have yellow or brown discharge from your nose.  You have pain in your face, especially when you bend forward.  You have a fever.  You have puffy (swollen) neck glands.  You have pain while swallowing.  You have white areas in the back of your throat. GET HELP RIGHT AWAY IF:   You have very bad or constant:  Headache.  Ear pain.  Pain in your forehead, behind your eyes, and over your cheekbones (sinus pain).  Chest pain.  You have long-lasting (chronic) lung disease and any of the following:  Wheezing.  Long-lasting cough.  Coughing up blood.  A change in your usual mucus.  You have a stiff neck.  You have  changes in your:  Vision.  Hearing.  Thinking.  Mood. MAKE SURE YOU:   Understand these instructions.  Will watch your condition.  Will get help right  away if you are not doing well or get worse.   This information is not intended to replace advice given to you by your health care provider. Make sure you discuss any questions you have with your health care provider.   Document Released: 07/26/2007 Document Revised: 06/23/2014 Document Reviewed: 05/14/2013 Elsevier Interactive Patient Education [employer].

## 2015-05-09 NOTE — ED Provider Notes (Signed)
[facility] Emergency Department Provider Note  ____________________________________________  Time seen: Approximately 105 AM  I have reviewed the triage vital signs and the nursing notes.   HISTORY  Chief Complaint Shortness of Breath and Neck Pain    HPI Jo Johnston is a 50 y.o. female who comes into the hospital today with left-sided neck and shoulder pain. The patient reports that when she woke up she did not feel well today. She reports that she just did not feel normal and did not want to do anything. She reports that this evening she started having some acute onset of sharp left shoulder pain. She reports that the pain as going from her shoulders into her left chest. It began around 10 PM and it was very severe. She felt some shortness of breath as well with these symptoms. The patient told her boyfriend and she was given some Motrin which she reports was 800 mg. The medicine did not help so the patient called the ambulance who brought her in to be evaluated. The patient has never had pain like this before. She reports that she's had a respiratory infection in the past but has not had any of this shortness of breath. She reports that the shoulder pain is worse when she takes a deep breath. The patient reports that currently it is better than it was before and rates her pain a 7-8 out of 10 in intensity. The patient also is reporting she has some sharp chest pain is worse with breathing. The patient is here for evaluation of her symptoms. She does have a cough that is nonproductive. At this time she is not feeling short of breath and she has not had any fevers. The patient did have an episode of vomiting this morning and reports that she does have sweats all the time due to menopause.   Past Medical History  Diagnosis Date  . Hypertension   . Diabetes mellitus without complication ([place])   . Asthma   . Bipolar 1 disorder ([place])     There are no active  problems to display for this patient.   History reviewed. No pertinent past surgical history.  Current Outpatient Rx  Name  Route  Sig  Dispense  Refill  . azithromycin (ZITHROMAX Z-PAK) 250 MG tablet      Take 2 tablets (500 mg) on  Day 1,  followed by 1 tablet (250 mg) once daily on Days 2 through 5.   6 each   0   . cetirizine (ZYRTEC) 10 MG tablet   Oral   Take 1 tablet (10 mg total) by mouth daily.   30 tablet   0   . fluticasone (FLONASE) 50 MCG/ACT nasal spray   Each Nare   Place 1 spray into both nostrils 2 (two) times daily.   16 g   0   . guaiFENesin (MUCINEX) 600 MG 12 hr tablet   Oral   Take 1 tablet (600 mg total) by mouth 2 (two) times daily.   60 tablet   2   . meloxicam (MOBIC) 15 MG tablet   Oral   Take 1 tablet (15 mg total) by mouth daily.   15 tablet   0   . naproxen (NAPROSYN) 500 MG tablet   Oral   Take 1 tablet (500 mg total) by mouth 2 (two) times daily with a meal.   60 tablet   0   . oxyCODONE-acetaminophen (ROXICET) 5-325 MG tablet   Oral  Take 1-2 tablets by mouth every 4 (four) hours as needed for severe pain.   15 tablet   0     Allergies Depakote; Lithium; Risperdal; and Seroquel  No family history on file.  Social History Social History  Substance Use Topics  . Smoking status: Current Some Day Smoker  . Smokeless tobacco: None  . Alcohol Use: No    Review of Systems Constitutional: No fever/chills Eyes: No visual changes. ENT: No sore throat. Cardiovascular:  chest pain. Respiratory: shortness of breath. Gastrointestinal: No abdominal pain.  No nausea, no vomiting.  No diarrhea.  No constipation. Genitourinary: Negative for dysuria. Musculoskeletal: Left shoulder pain Skin: Negative for rash. Neurological: Negative for headaches, focal weakness or numbness.  10-point ROS otherwise negative.  ____________________________________________   PHYSICAL EXAM:  VITAL SIGNS: ED Triage Vitals  Enc Vitals Group      BP 05/09/15 0016 122/87 mmHg     Pulse Rate 05/09/15 0016 95     Resp 05/09/15 0016 28     Temp 05/09/15 0016 99 F (37.2 C)     Temp Source 05/09/15 0016 Oral     SpO2 05/09/15 0016 98 %     Weight 05/09/15 0016 116 lb (52.617 kg)     Height 05/09/15 0016 4\' 10"  (1.473 m)     Head Cir --      Peak Flow --      Pain Score 05/09/15 0011 10     Pain Loc --      Pain Edu? --      Excl. in [place]? --     Constitutional: Alert and oriented. Well appearing and in moderate distress. Eyes: Conjunctivae are normal. PERRL. EOMI. Head: Atraumatic. Nose: No congestion/rhinnorhea. Mouth/Throat: Mucous membranes are moist.  Oropharynx non-erythematous. Cardiovascular: Normal rate, regular rhythm. Grossly normal heart sounds.  Good peripheral circulation. Respiratory: Normal respiratory effort.  No retractions. Lungs CTAB. Gastrointestinal: Soft and nontender. No distention. Positive bowel sounds Musculoskeletal: Left shoulder tenderness to palpation and left anterior chest wall tenderness to palpation.   Neurologic:  Normal speech and language.  Skin:  Skin is warm, dry and intact.  Psychiatric: Mood and affect are normal.   ____________________________________________   LABS (all labs ordered are listed, but only abnormal results are displayed)  Labs Reviewed  CBC - Abnormal; Notable for the following:    WBC 19.3 (*)    Hemoglobin 11.1 (*)    HCT 33.9 (*)    All other components within normal limits  COMPREHENSIVE METABOLIC PANEL - Abnormal; Notable for the following:    Potassium 3.4 (*)    CO2 21 (*)    Glucose, Bld 122 (*)    BUN 25 (*)    Creatinine, Ser 1.10 (*)    Calcium 8.4 (*)    GFR calc non Af Amer 58 (*)    All other components within normal limits  TROPONIN I  FIBRIN DERIVATIVES D-DIMER ([facility] ONLY)  TROPONIN I   ____________________________________________  EKG  ED ECG REPORT I, Loney Hering, the attending physician, personally viewed and  interpreted this ECG.   Date: 05/09/2015  EKG Time: 0011  Rate: 98  Rhythm: normal sinus rhythm  Axis: normal  Intervals:none  ST&T Change: none  ____________________________________________  RADIOLOGY  CXR: Mild vascular congestion noted, lungs remain grossly clear  Left shoulder x-ray: Before meals arthritis, no acute findings are evident. ____________________________________________   PROCEDURES  Procedure(s) performed: None  Critical Care performed: No  ____________________________________________  INITIAL IMPRESSION / ASSESSMENT AND PLAN / ED COURSE  Pertinent labs & imaging results that were available during my care of the patient were reviewed by me and considered in my medical decision making (see chart for details).  This is a 50 year old female who comes into the hospital today with some left shoulder pain that radiates in her chest. The patient initially received some aspirin as well as nitroglycerin when she arrived. The patient continued to have some pain so I did give her some morphine 4 mg IV 1 dose. The patient did have a cough and has a white count of 19 sided give her some azithromycin although her chest x-ray does not show an infiltrate. The patient continued to have pain although her both troponins and blood work is unremarkable. I decided to give the patient some Toradol and her pain improved to a 4 out of 10 in intensity. I feel the patient's shoulder pain may be due to arthritis and that she needs to follow up with orthopedic surgery. I did give her some azithromycin and we'll give her some azithromycin for home. Otherwise the patient be discharged home to follow-up with her doctor. Pain is improved at this time. ____________________________________________   FINAL CLINICAL IMPRESSION(S) / ED DIAGNOSES  Final diagnoses:  Chest pain, unspecified chest pain type  Left shoulder pain  Bronchitis      Loney Hering, MD 05/09/15 [phone]

## 2015-05-09 NOTE — ED Notes (Signed)
Pt resting comfortably

## 2015-05-09 NOTE — ED Notes (Signed)
Pt crying out in pain. Pt reports morphine did not help. MD aware.

## 2015-05-09 NOTE — ED Notes (Signed)
Per [facility] pt awoke from sleep with left arm pain. EMS reports 126/71, HR 108, and O2 99 on RA. Pt denies injury to shoulder. Pt reports being SOB and diarrhea today. Pt is a/o x4 at this time in triage.

## 2015-05-30 ENCOUNTER — Other Ambulatory Visit
Admission: AD | Admit: 2015-05-30 | Discharge: 2015-05-30 | Disposition: A | Discharge status: Home / Self Care | Attending: Emergency Medicine | Admitting: Emergency Medicine

## 2015-05-30 NOTE — ED Notes (Signed)
Police lab draw. Pt in police custody

## 2015-07-07 ENCOUNTER — Other Ambulatory Visit: Payer: Self-pay | Admitting: Family Medicine

## 2015-07-07 DIAGNOSIS — Z1231 Encounter for screening mammogram for malignant neoplasm of breast: Secondary | ICD-10-CM

## 2015-07-09 ENCOUNTER — Telehealth: Payer: Self-pay | Admitting: *Deleted

## 2015-07-09 NOTE — Telephone Encounter (Signed)
lm on vm making the pt aware that her appt for 08/13/15 was cancelled due to dr. Idelia Salm no longer taking new pts. i made her awaer that her referral will go to a different doctor for review.Marland KitchenMarland KitchenTD

## 2015-07-13 ENCOUNTER — Telehealth: Payer: Self-pay | Admitting: *Deleted

## 2015-07-13 NOTE — Telephone Encounter (Signed)
Made pt aware that I was returning her call from [facility]. I asked the pt to please return my call....TD

## 2015-07-15 ENCOUNTER — Telehealth: Payer: Self-pay | Admitting: *Deleted

## 2015-07-15 NOTE — Telephone Encounter (Signed)
Return pt call made her aware that Dr. Idelia Salm is no longer accepting new pts. I informed her that I would give her records to another doctor for review.Marland KitchenMarland KitchenTD

## 2015-07-16 ENCOUNTER — Ambulatory Visit: Admitting: Anesthesiology

## 2015-07-22 ENCOUNTER — Ambulatory Visit
Admission: RE | Admit: 2015-07-22 | Discharge: 2015-07-22 | Disposition: A | Discharge status: Home / Self Care | Payer: Medicaid Other | Source: Ambulatory Visit | Attending: Family Medicine | Admitting: Family Medicine

## 2015-07-22 DIAGNOSIS — Z1231 Encounter for screening mammogram for malignant neoplasm of breast: Secondary | ICD-10-CM | POA: Diagnosis present

## 2015-08-03 ENCOUNTER — Other Ambulatory Visit: Payer: Self-pay | Admitting: Family Medicine

## 2015-08-03 DIAGNOSIS — R928 Other abnormal and inconclusive findings on diagnostic imaging of breast: Secondary | ICD-10-CM

## 2015-08-10 ENCOUNTER — Ambulatory Visit
Admission: RE | Admit: 2015-08-10 | Discharge: 2015-08-10 | Disposition: A | Discharge status: Home / Self Care | Payer: Medicaid Other | Source: Ambulatory Visit | Attending: Family Medicine | Admitting: Family Medicine

## 2015-08-10 DIAGNOSIS — R928 Other abnormal and inconclusive findings on diagnostic imaging of breast: Secondary | ICD-10-CM

## 2015-08-10 DIAGNOSIS — N6489 Other specified disorders of breast: Secondary | ICD-10-CM | POA: Insufficient documentation

## 2015-08-13 ENCOUNTER — Ambulatory Visit: Admitting: Anesthesiology

## 2015-09-02 ENCOUNTER — Emergency Department (HOSPITAL_COMMUNITY): Payer: Medicaid Other

## 2015-09-02 ENCOUNTER — Other Ambulatory Visit: Payer: Self-pay | Admitting: Orthopedic Surgery

## 2015-09-02 ENCOUNTER — Inpatient Hospital Stay (HOSPITAL_COMMUNITY)
Admission: EM | Admit: 2015-09-02 | Discharge: 2015-09-13 | DRG: 023 | Disposition: A | Discharge status: Skilled Nursing Facility | Payer: Medicaid Other | Attending: Neurosurgery | Admitting: Neurosurgery

## 2015-09-02 ENCOUNTER — Encounter (HOSPITAL_COMMUNITY): Payer: Self-pay

## 2015-09-02 DIAGNOSIS — J988 Other specified respiratory disorders: Secondary | ICD-10-CM | POA: Diagnosis not present

## 2015-09-02 DIAGNOSIS — I615 Nontraumatic intracerebral hemorrhage, intraventricular: Secondary | ICD-10-CM | POA: Diagnosis present

## 2015-09-02 DIAGNOSIS — I612 Nontraumatic intracerebral hemorrhage in hemisphere, unspecified: Secondary | ICD-10-CM | POA: Diagnosis not present

## 2015-09-02 DIAGNOSIS — R402362 Coma scale, best motor response, obeys commands, at arrival to emergency department: Secondary | ICD-10-CM | POA: Diagnosis present

## 2015-09-02 DIAGNOSIS — Z9889 Other specified postprocedural states: Secondary | ICD-10-CM

## 2015-09-02 DIAGNOSIS — I610 Nontraumatic intracerebral hemorrhage in hemisphere, subcortical: Secondary | ICD-10-CM | POA: Diagnosis not present

## 2015-09-02 DIAGNOSIS — R680 Hypothermia, not associated with low environmental temperature: Secondary | ICD-10-CM | POA: Diagnosis present

## 2015-09-02 DIAGNOSIS — R402122 Coma scale, eyes open, to pain, at arrival to emergency department: Secondary | ICD-10-CM | POA: Diagnosis present

## 2015-09-02 DIAGNOSIS — I69991 Dysphagia following unspecified cerebrovascular disease: Secondary | ICD-10-CM | POA: Insufficient documentation

## 2015-09-02 DIAGNOSIS — J9690 Respiratory failure, unspecified, unspecified whether with hypoxia or hypercapnia: Secondary | ICD-10-CM

## 2015-09-02 DIAGNOSIS — I161 Hypertensive emergency: Secondary | ICD-10-CM | POA: Diagnosis present

## 2015-09-02 DIAGNOSIS — R001 Bradycardia, unspecified: Secondary | ICD-10-CM | POA: Diagnosis not present

## 2015-09-02 DIAGNOSIS — J9602 Acute respiratory failure with hypercapnia: Secondary | ICD-10-CM

## 2015-09-02 DIAGNOSIS — M461 Sacroiliitis, not elsewhere classified: Secondary | ICD-10-CM

## 2015-09-02 DIAGNOSIS — Z978 Presence of other specified devices: Secondary | ICD-10-CM

## 2015-09-02 DIAGNOSIS — F3177 Bipolar disorder, in partial remission, most recent episode mixed: Secondary | ICD-10-CM | POA: Insufficient documentation

## 2015-09-02 DIAGNOSIS — R4689 Other symptoms and signs involving appearance and behavior: Secondary | ICD-10-CM

## 2015-09-02 DIAGNOSIS — R402222 Coma scale, best verbal response, incomprehensible words, at arrival to emergency department: Secondary | ICD-10-CM | POA: Diagnosis present

## 2015-09-02 DIAGNOSIS — E876 Hypokalemia: Secondary | ICD-10-CM | POA: Diagnosis present

## 2015-09-02 DIAGNOSIS — F319 Bipolar disorder, unspecified: Secondary | ICD-10-CM | POA: Diagnosis present

## 2015-09-02 DIAGNOSIS — D649 Anemia, unspecified: Secondary | ICD-10-CM | POA: Diagnosis present

## 2015-09-02 DIAGNOSIS — E1165 Type 2 diabetes mellitus with hyperglycemia: Secondary | ICD-10-CM | POA: Diagnosis present

## 2015-09-02 DIAGNOSIS — G936 Cerebral edema: Secondary | ICD-10-CM | POA: Diagnosis present

## 2015-09-02 DIAGNOSIS — C799 Secondary malignant neoplasm of unspecified site: Secondary | ICD-10-CM

## 2015-09-02 DIAGNOSIS — G8929 Other chronic pain: Secondary | ICD-10-CM

## 2015-09-02 DIAGNOSIS — C711 Malignant neoplasm of frontal lobe: Secondary | ICD-10-CM | POA: Diagnosis present

## 2015-09-02 DIAGNOSIS — D72829 Elevated white blood cell count, unspecified: Secondary | ICD-10-CM | POA: Diagnosis present

## 2015-09-02 DIAGNOSIS — G8194 Hemiplegia, unspecified affecting left nondominant side: Secondary | ICD-10-CM | POA: Diagnosis present

## 2015-09-02 DIAGNOSIS — M47816 Spondylosis without myelopathy or radiculopathy, lumbar region: Secondary | ICD-10-CM

## 2015-09-02 DIAGNOSIS — Z79899 Other long term (current) drug therapy: Secondary | ICD-10-CM

## 2015-09-02 DIAGNOSIS — G939 Disorder of brain, unspecified: Secondary | ICD-10-CM | POA: Diagnosis not present

## 2015-09-02 DIAGNOSIS — I611 Nontraumatic intracerebral hemorrhage in hemisphere, cortical: Principal | ICD-10-CM | POA: Diagnosis present

## 2015-09-02 DIAGNOSIS — F17200 Nicotine dependence, unspecified, uncomplicated: Secondary | ICD-10-CM | POA: Diagnosis present

## 2015-09-02 DIAGNOSIS — Z888 Allergy status to other drugs, medicaments and biological substances status: Secondary | ICD-10-CM | POA: Diagnosis not present

## 2015-09-02 DIAGNOSIS — M5441 Lumbago with sciatica, right side: Principal | ICD-10-CM

## 2015-09-02 DIAGNOSIS — J9601 Acute respiratory failure with hypoxia: Secondary | ICD-10-CM | POA: Diagnosis present

## 2015-09-02 DIAGNOSIS — I10 Essential (primary) hypertension: Secondary | ICD-10-CM | POA: Insufficient documentation

## 2015-09-02 DIAGNOSIS — R739 Hyperglycemia, unspecified: Secondary | ICD-10-CM | POA: Insufficient documentation

## 2015-09-02 DIAGNOSIS — Z4659 Encounter for fitting and adjustment of other gastrointestinal appliance and device: Secondary | ICD-10-CM

## 2015-09-02 DIAGNOSIS — I619 Nontraumatic intracerebral hemorrhage, unspecified: Secondary | ICD-10-CM | POA: Diagnosis present

## 2015-09-02 DIAGNOSIS — M4316 Spondylolisthesis, lumbar region: Secondary | ICD-10-CM

## 2015-09-02 DIAGNOSIS — R4189 Other symptoms and signs involving cognitive functions and awareness: Secondary | ICD-10-CM | POA: Insufficient documentation

## 2015-09-02 LAB — I-STAT CHEM 8, ED
BUN: 23 mg/dL — AB (ref 6–20)
CREATININE: 0.7 mg/dL (ref 0.44–1.00)
Calcium, Ion: 1.12 mmol/L — ABNORMAL LOW (ref 1.13–1.30)
Chloride: 105 mmol/L (ref 101–111)
GLUCOSE: 141 mg/dL — AB (ref 65–99)
HCT: 45 % (ref 36.0–46.0)
HEMOGLOBIN: 15.3 g/dL — AB (ref 12.0–15.0)
POTASSIUM: 4.4 mmol/L (ref 3.5–5.1)
Sodium: 143 mmol/L (ref 135–145)
TCO2: 25 mmol/L (ref 0–100)

## 2015-09-02 LAB — COMPREHENSIVE METABOLIC PANEL
ALBUMIN: 4.1 g/dL (ref 3.5–5.0)
ALT: 20 U/L (ref 14–54)
ANION GAP: 10 (ref 5–15)
AST: 37 U/L (ref 15–41)
Alkaline Phosphatase: 72 U/L (ref 38–126)
BILIRUBIN TOTAL: 0.8 mg/dL (ref 0.3–1.2)
BUN: 15 mg/dL (ref 6–20)
CHLORIDE: 106 mmol/L (ref 101–111)
CO2: 24 mmol/L (ref 22–32)
Calcium: 9.9 mg/dL (ref 8.9–10.3)
Creatinine, Ser: 0.85 mg/dL (ref 0.44–1.00)
GFR calc Af Amer: >60 mL/min (ref >60)
GFR calc non Af Amer: >60 mL/min (ref >60)
GLUCOSE: 142 mg/dL — AB (ref 65–99)
POTASSIUM: 4.2 mmol/L (ref 3.5–5.1)
SODIUM: 140 mmol/L (ref 135–145)
TOTAL PROTEIN: 7.5 g/dL (ref 6.5–8.1)

## 2015-09-02 LAB — SODIUM: Sodium: 137 mmol/L (ref 135–145)

## 2015-09-02 LAB — CBC
HCT: 41.6 % (ref 36.0–46.0)
Hemoglobin: 13.8 g/dL (ref 12.0–15.0)
MCH: 27.1 pg (ref 26.0–34.0)
MCHC: 33.2 g/dL (ref 30.0–36.0)
MCV: 81.6 fL (ref 78.0–100.0)
PLATELETS: 249 10*3/uL (ref 150–400)
RBC: 5.1 MIL/uL (ref 3.87–5.11)
RDW: 13.4 % (ref 11.5–15.5)
WBC: 20.1 10*3/uL — ABNORMAL HIGH (ref 4.0–10.5)

## 2015-09-02 LAB — POCT I-STAT 3, ART BLOOD GAS (G3+)
ACID-BASE EXCESS: 1 mmol/L (ref 0.0–2.0)
BICARBONATE: 24.8 meq/L — AB (ref 20.0–24.0)
O2 Saturation: 100 %
TCO2: 26 mmol/L (ref 0–100)
pCO2 arterial: 35.5 mmHg (ref 35.0–45.0)
pH, Arterial: 7.452 — ABNORMAL HIGH (ref 7.350–7.450)
pO2, Arterial: 232 mmHg — ABNORMAL HIGH (ref 80.0–100.0)

## 2015-09-02 LAB — GLUCOSE, CAPILLARY
GLUCOSE-CAPILLARY: 134 mg/dL — AB (ref 65–99)
Glucose-Capillary: 120 mg/dL — ABNORMAL HIGH (ref 65–99)

## 2015-09-02 LAB — I-STAT ARTERIAL BLOOD GAS, ED
ACID-BASE DEFICIT: 9 mmol/L — AB (ref 0.0–2.0)
BICARBONATE: 17.8 meq/L — AB (ref 20.0–24.0)
O2 SAT: 79 %
PO2 ART: 50 mmHg — AB (ref 80.0–100.0)
TCO2: 19 mmol/L (ref 0–100)
pCO2 arterial: 40.2 mmHg (ref 35.0–45.0)
pH, Arterial: 7.254 — ABNORMAL LOW (ref 7.350–7.450)

## 2015-09-02 LAB — URINE MICROSCOPIC-ADD ON

## 2015-09-02 LAB — DIFFERENTIAL
BASOS ABS: 0 10*3/uL (ref 0.0–0.1)
BASOS PCT: 0 %
EOS ABS: 0 10*3/uL (ref 0.0–0.7)
EOS PCT: 0 %
Lymphocytes Relative: 12 %
Lymphs Abs: 2.3 10*3/uL (ref 0.7–4.0)
Monocytes Absolute: 1.1 10*3/uL — ABNORMAL HIGH (ref 0.1–1.0)
Monocytes Relative: 6 %
NEUTROS PCT: 82 %
Neutro Abs: 16.7 10*3/uL — ABNORMAL HIGH (ref 1.7–7.7)

## 2015-09-02 LAB — CBG MONITORING, ED: Glucose-Capillary: 146 mg/dL — ABNORMAL HIGH (ref 65–99)

## 2015-09-02 LAB — URINALYSIS, ROUTINE W REFLEX MICROSCOPIC
Bilirubin Urine: NEGATIVE
GLUCOSE, UA: NEGATIVE mg/dL
KETONES UR: NEGATIVE mg/dL
LEUKOCYTES UA: NEGATIVE
Nitrite: NEGATIVE
PH: 6 (ref 5.0–8.0)
Protein, ur: 100 mg/dL — AB
Specific Gravity, Urine: 1.028 (ref 1.005–1.030)

## 2015-09-02 LAB — RAPID URINE DRUG SCREEN, HOSP PERFORMED
Amphetamines: NOT DETECTED
BENZODIAZEPINES: NOT DETECTED
Barbiturates: NOT DETECTED
COCAINE: NOT DETECTED
OPIATES: NOT DETECTED
TETRAHYDROCANNABINOL: NOT DETECTED

## 2015-09-02 LAB — ACETAMINOPHEN LEVEL: Acetaminophen (Tylenol), Serum: <10 ug/mL — ABNORMAL LOW (ref 10–30)

## 2015-09-02 LAB — ETHANOL: Alcohol, Ethyl (B): <5 mg/dL (ref <5)

## 2015-09-02 LAB — PROTIME-INR
INR: 1.04 (ref 0.00–1.49)
PROTHROMBIN TIME: 13.8 s (ref 11.6–15.2)

## 2015-09-02 LAB — I-STAT TROPONIN, ED: Troponin i, poc: 0.07 ng/mL (ref 0.00–0.08)

## 2015-09-02 LAB — OSMOLALITY: Osmolality: 308 mOsm/kg — ABNORMAL HIGH (ref 275–295)

## 2015-09-02 LAB — TRIGLYCERIDES: Triglycerides: 198 mg/dL — ABNORMAL HIGH (ref <150)

## 2015-09-02 LAB — APTT: APTT: 23 s — AB (ref 24–37)

## 2015-09-02 LAB — MRSA PCR SCREENING: MRSA BY PCR: NEGATIVE

## 2015-09-02 LAB — SALICYLATE LEVEL: Salicylate Lvl: <4 mg/dL (ref 2.8–30.0)

## 2015-09-02 MED ORDER — STROKE: EARLY STAGES OF RECOVERY BOOK
Freq: Once | Status: DC
  Filled 2015-09-02: qty 1

## 2015-09-02 MED ORDER — FENTANYL CITRATE (PF) 100 MCG/2ML IJ SOLN: 100.0000 ug | INTRAMUSCULAR | Status: DC | PRN

## 2015-09-02 MED ORDER — SENNOSIDES-DOCUSATE SODIUM 8.6-50 MG PO TABS
1.0000 | ORAL_TABLET | Freq: Two times a day (BID) | ORAL | Status: DC
  Administered 2015-09-03 – 2015-09-13 (×18): 1 via ORAL
  Filled 2015-09-02 (×20): qty 1

## 2015-09-02 MED ORDER — MANNITOL 25 % IV SOLN
37.5000 g | Freq: Once | INTRAVENOUS | Status: AC
  Administered 2015-09-02: 37.5 g via INTRAVENOUS
  Filled 2015-09-02 (×2): qty 150

## 2015-09-02 MED ORDER — MANNITOL 20 % IV SOLN
1.0000 g/kg | Freq: Once | INTRAVENOUS | Status: DC
  Filled 2015-09-02 (×2): qty 500

## 2015-09-02 MED ORDER — MANNITOL 25 % IV SOLN
12.5000 g | Freq: Once | INTRAVENOUS | Status: AC
  Administered 2015-09-02: 12.5 g via INTRAVENOUS
  Filled 2015-09-02: qty 50

## 2015-09-02 MED ORDER — MANNITOL 20 % IV SOLN
25.0000 g | Freq: Four times a day (QID) | INTRAVENOUS | Status: DC
  Administered 2015-09-03 (×2): 25 g via INTRAVENOUS
  Filled 2015-09-02 (×9): qty 500

## 2015-09-02 MED ORDER — FAMOTIDINE IN NACL 20-0.9 MG/50ML-% IV SOLN: 20.0000 mg | Freq: Two times a day (BID) | INTRAVENOUS | Status: DC

## 2015-09-02 MED ORDER — PROPOFOL 1000 MG/100ML IV EMUL
0.0000 ug/kg/min | INTRAVENOUS | Status: DC
  Administered 2015-09-02: 40 ug/kg/min via INTRAVENOUS
  Administered 2015-09-03: 30 ug/kg/min via INTRAVENOUS
  Administered 2015-09-03: 40 ug/kg/min via INTRAVENOUS
  Filled 2015-09-02 (×4): qty 100

## 2015-09-02 MED ORDER — FAMOTIDINE IN NACL 20-0.9 MG/50ML-% IV SOLN
20.0000 mg | Freq: Two times a day (BID) | INTRAVENOUS | Status: DC
  Administered 2015-09-02 – 2015-09-08 (×12): 20 mg via INTRAVENOUS
  Filled 2015-09-02 (×12): qty 50

## 2015-09-02 MED ORDER — ANTISEPTIC ORAL RINSE SOLUTION (CORINZ)
7.0000 mL | Freq: Four times a day (QID) | OROMUCOSAL | Status: DC
  Administered 2015-09-02 – 2015-09-13 (×36): 7 mL via OROMUCOSAL

## 2015-09-02 MED ORDER — PROPOFOL 1000 MG/100ML IV EMUL
INTRAVENOUS | Status: AC
  Filled 2015-09-02: qty 100

## 2015-09-02 MED ORDER — ETOMIDATE 2 MG/ML IV SOLN
INTRAVENOUS | Status: DC | PRN
  Administered 2015-09-02: 20 mg via INTRAVENOUS

## 2015-09-02 MED ORDER — MANNITOL 20 % IV SOLN
0.5000 g/kg | Freq: Four times a day (QID) | INTRAVENOUS | Status: DC
  Administered 2015-09-02: 26.4 g via INTRAVENOUS
  Filled 2015-09-02 (×3): qty 500

## 2015-09-02 MED ORDER — ACETAMINOPHEN 325 MG PO TABS
650.0000 mg | ORAL_TABLET | ORAL | Status: DC | PRN
  Administered 2015-09-03: 650 mg via ORAL
  Filled 2015-09-02: qty 2

## 2015-09-02 MED ORDER — ALBUTEROL SULFATE (2.5 MG/3ML) 0.083% IN NEBU: 2.5000 mg | INHALATION_SOLUTION | RESPIRATORY_TRACT | Status: DC | PRN

## 2015-09-02 MED ORDER — HYDRALAZINE HCL 20 MG/ML IJ SOLN
10.0000 mg | INTRAMUSCULAR | Status: DC | PRN
  Administered 2015-09-04: 10 mg via INTRAVENOUS
  Administered 2015-09-07: 20 mg via INTRAVENOUS
  Administered 2015-09-08: 40 mg via INTRAVENOUS
  Filled 2015-09-02: qty 2
  Filled 2015-09-02 (×3): qty 1

## 2015-09-02 MED ORDER — ACETAMINOPHEN 650 MG RE SUPP: 650.0000 mg | RECTAL | Status: DC | PRN

## 2015-09-02 MED ORDER — PROPOFOL 1000 MG/100ML IV EMUL: 5.0000 ug/kg/min | Freq: Once | INTRAVENOUS | Status: AC

## 2015-09-02 MED ORDER — CHLORHEXIDINE GLUCONATE 0.12% ORAL RINSE (MEDLINE KIT)
15.0000 mL | Freq: Two times a day (BID) | OROMUCOSAL | Status: DC
  Administered 2015-09-02 – 2015-09-13 (×19): 15 mL via OROMUCOSAL

## 2015-09-02 MED ORDER — INSULIN ASPART 100 UNIT/ML ~~LOC~~ SOLN
2.0000 [IU] | SUBCUTANEOUS | Status: DC
  Administered 2015-09-02 – 2015-09-05 (×13): 2 [IU] via SUBCUTANEOUS
  Administered 2015-09-05: 4 [IU] via SUBCUTANEOUS
  Administered 2015-09-05 – 2015-09-06 (×4): 2 [IU] via SUBCUTANEOUS

## 2015-09-02 MED ORDER — SUCCINYLCHOLINE CHLORIDE 20 MG/ML IJ SOLN
INTRAMUSCULAR | Status: DC | PRN
  Administered 2015-09-02: 60 mg via INTRAVENOUS

## 2015-09-02 NOTE — ED Notes (Signed)
[facility]- pt here with AMS. Pt was c/o severe headache at 2200 to husband. Pt responsive to painful stimuli only with EMS. Vital signs stable. 2mg  of Narcan administered PTA without change.

## 2015-09-02 NOTE — Progress Notes (Signed)
eLink Physician-Brief Progress Note Patient Name: Jo Johnston DOB: 02-05-1966 MRN: [id number]   Date of Service  09/02/2015  HPI/Events of Note  New pt evaln. Camera check done > pt looks comfortable.  Sedated. Intubated. 130/90  99  23  100%.   Pt admitted with IC bleed with midline shift. Intubated for airway protection.   Neuro is primary. PCCM for vent management.   eICU Interventions  Labs reviewed, CXR reveiwed. ABG reviewed. O2 sats better at 100%. On propofol. Will be getting hypertonic saline. Not on pressors.  Cont present management.         [place] 09/02/2015, 7:07 PM

## 2015-09-02 NOTE — H&P (Signed)
H&P    Chief Complaint: ICH    HPI:                                                                                                                                         Jo Johnston is an 50 y.o. female brought to ED after noted to be unresponsive. History id very unclear. Sister lives in [place] and is on her way. She keeps asking if she "overdosed". Possibly husband found her? EMS brought her to ED. On arrival she is non verbal and has eye deviation to the right.   Date last known well: yesterday Time last known well: Time: 22:00 tPA Given: No: ICH   Intracerebral Hemorrhage (ICH) Score  3   Past Medical History  Diagnosis Date  . Hypertension   . Diabetes mellitus without complication ([place])   . Asthma   . Bipolar 1 disorder [facility])     Past Surgical History  Procedure Laterality Date  . Breast excisional biopsy Right 15+ yrs ago    LARGE SEBACEOUS CYST    Family History  Problem Relation Age of Onset  . Breast cancer Mother    Social History:  reports that she has been smoking.  She does not have any smokeless tobacco history on file. She reports that she does not drink alcohol. Her drug history is not on file.  Allergies:  Allergies  Allergen Reactions  . Depakote [Valproic Acid] Other (See Comments)    Headache  . Lithium Hives  . Risperdal [Risperidone] Other (See Comments)    Headache  . Seroquel [Quetiapine Fumarate] Other (See Comments)    Headache    Medications:                                                                                                                          Current Facility-Administered Medications  Medication Dose Route Frequency Provider Last Rate Last Dose  . mannitol 20 % infusion 1 g/kg  1 g/kg Intravenous Once Marliss Coots, PA-C       Current Outpatient Prescriptions  Medication Sig Dispense Refill  . cetirizine (ZYRTEC) 10 MG tablet Take 1 tablet (10 mg total) by mouth daily. 30 tablet 0  . fluticasone  (FLONASE) 50 MCG/ACT nasal spray Place 1 spray into both nostrils 2 (two) times daily. 16 g 0  . guaiFENesin (MUCINEX) 600  MG 12 hr tablet Take 1 tablet (600 mg total) by mouth 2 (two) times daily. 60 tablet 2  . meloxicam (MOBIC) 15 MG tablet Take 1 tablet (15 mg total) by mouth daily. 15 tablet 0  . naproxen (NAPROSYN) 500 MG tablet Take 1 tablet (500 mg total) by mouth 2 (two) times daily with a meal. 60 tablet 0  . oxyCODONE-acetaminophen (ROXICET) 5-325 MG tablet Take 1-2 tablets by mouth every 4 (four) hours as needed for severe pain. 15 tablet 0     ROS:                                                                                                                                       History obtained from unobtainable from patient due to mental status   Neurologic Examination:                                                                                                      Weight 52.6 kg (115 lb 15.4 oz).  HEENT-  Normocephalic, no lesions, without obvious abnormality.  Normal external eye and conjunctiva.  Normal TM's bilaterally.  Normal auditory canals and external ears. Normal external nose, mucus membranes and septum.  Normal pharynx. Cardiovascular- S1, S2 normal, pulses palpable throughout   Lungs- chest clear, no wheezing, rales, normal symmetric air entry Abdomen- normal findings: bowel sounds normal Extremities- no edema Lymph-no adenopathy palpable Musculoskeletal-no joint tenderness, deformity or swelling Skin-warm and dry, no hyperpigmentation, vitiligo, or suspicious lesions  Neurological Examination Mental Status: Follows no commands. Dose localize to sternal rub with right arm.  Cranial Nerves: II: no blink to threat, pupils equal, round, reactive to light and accommodation III,IV, VI: eyes deviated to the right and does not cross midline.  V,VII: face symmetric at rest,   Motor: Right : Upper extremity   4/5    Left:     Upper extremity   0/5  Lower  extremity   3/5     Lower extremity   0/5 --to pain the right leg will lift slightly but not off bed Tone and bulk:normal tone throughout; no atrophy noted Sensory: localizes to sternal rub and slightly withdraws in right leg -no response on the left.  Deep Tendon Reflexes: 2+ right arm and leg, 1+ on the left arm and KJ Plantars: Right: downgoing   Left: downgoing Cerebellar: Unable to assess Gait: not tested       Lab Results: Basic Metabolic Panel:  Recent Labs Lab  09/02/15 1546  NA 143  K 4.4  CL 105  GLUCOSE 141*  BUN 23*  CREATININE 0.70    Liver Function Tests: No results for input(s): AST, ALT, ALKPHOS, BILITOT, PROT, ALBUMIN in the last 168 hours. No results for input(s): LIPASE, AMYLASE in the last 168 hours. No results for input(s): AMMONIA in the last 168 hours.  CBC:  Recent Labs Lab 09/02/15 1532 09/02/15 1546  WBC 20.1*  --   NEUTROABS 16.7*  --   HGB 13.8 15.3*  HCT 41.6 45.0  MCV 81.6  --   PLT 249  --     Cardiac Enzymes: No results for input(s): CKTOTAL, CKMB, CKMBINDEX, TROPONINI in the last 168 hours.  Lipid Panel: No results for input(s): CHOL, TRIG, HDL, CHOLHDL, VLDL, LDLCALC in the last 168 hours.  CBG:  Recent Labs Lab 09/02/15 1503  GLUCAP 146*    Microbiology: No results found for this or any previous visit.  Coagulation Studies: No results for input(s): LABPROT, INR in the last 72 hours.  Imaging: Ct Head Code Stroke W/o Cm  09/02/2015  CLINICAL DATA:  Altered mental status and headaches, unresponsive EXAM: CT HEAD WITHOUT CONTRAST TECHNIQUE: Contiguous axial images were obtained from the base of the skull through the vertex without intravenous contrast. COMPARISON:  03/31/2015 FINDINGS: Bony calvarium is intact. No gross soft tissue abnormality is seen. In the right frontal lobe there is a large intraparenchymal hemorrhage with some surrounding white matter edema. The hemorrhage measures approximately 6.1 x 4.3 cm in  greatest AP and transverse dimensions. Additionally it measures approximately 4.3 cm in craniocaudad projection. Adjacent to this area of parenchymal hemorrhage and somewhat encased by the hemorrhage is a a rounded soft tissue mass lesion which measures 4.1 x 3.2 cm and it also demonstrates some significant white matter edema and is highly suspicious for underlying metastatic disease. Significant midline shift is noted from right to left of approximately 12-13 mm. A small amount of intraventricular component of the hemorrhage is noted in the lateral, third and fourth ventricles. IMPRESSION: Right frontal intraparenchymal hematoma as described. Some intraventricular extension is seen as well as significant midline shift from right to left. Rounded soft tissue lesion with some surrounding white matter edema which is somewhat encased by the parenchymal hematoma and is highly suggestive of metastatic disease. This lesion is new from the prior exam. Critical Value/emergent results were called by telephone at the time of interpretation on 09/02/2015 at 3:26 pm to Dr. Vanita Panda, who verbally acknowledged these results. Electronically Signed   By: Inez Catalina M.D.   On: 09/02/2015 15:25       Assessment and plan discussed with with attending physician and they are in agreement.    Etta Quill PA-C [facility] [phone]  09/02/2015, 3:58 PM   Assessment: 50 y.o. female with acute right frontal bleed with shift and 4th ventrical involvement. Concern for Metastasis. ICH score 3. Currently breathing well but concern for further decompensation. Neurosurgery has been consulted for evaluation of scan and possible further intervention in future. Mannitol has been initiated at 1 gram/KG.   Stroke Risk Factors - diabetes mellitus and hypertension  Plan: 1) Admit to ICU 2) Mannitol 1g/kg x 1 and then .5G Q 6 hours. Q6hr Na and Osm checks, hold for Na > 155 and/or Osm > 320 3) Repeat CT if  decompensation 4) BP control with Systolic [id number] 5) Intubation for airway protection 6) Nsurg consult 7) MRI brain with and without contrast to eval  possible mass

## 2015-09-02 NOTE — Consult Note (Signed)
PULMONARY / CRITICAL CARE MEDICINE   Name: Jo Johnston MRN: [id number] DOB: 04/19/1965    ADMISSION DATE:  09/02/2015 CONSULTATION DATE:  7/13  REFERRING MD:  Dr Hollice Gong Neurology  CHIEF COMPLAINT:  HA and AMS  HISTORY OF PRESENT ILLNESS:   50 year old female with PMH as below, which includes HTN, DM, Asthma, and bipolar disorder. She has apparently been complaining of headache for some time now, which significantly worsened 7/12 PM before going to bed. 7/13 in the morning she was not waking up so her husband Jo Johnston let her sleep a little longer. When he went back to check on her a few hours later he noted that she had voided her bladder into the bed. For that reason he called EMS and she was transported to [facility] ED. She was intubated for airway protection in ED. CT scan with ICH and possible malignancy. She was admitted by neurology who is prescribing mannitol and has consulted neurosurgery. PCCM to see for vent management.   PAST MEDICAL HISTORY :  She  has a past medical history of Hypertension; Diabetes mellitus without complication ([place]); Asthma; and Bipolar 1 disorder ([place]).  PAST SURGICAL HISTORY: She  has past surgical history that includes Breast excisional biopsy (Right, 15+ yrs ago).  Allergies  Allergen Reactions  . Haloperidol Hives  . Depakote [Valproic Acid] Other (See Comments)    Headache  . Lithium Hives  . Risperdal [Risperidone] Other (See Comments)    Headache  . Seroquel [Quetiapine Fumarate] Other (See Comments)    Headache    No current facility-administered medications on file prior to encounter.   Current Outpatient Prescriptions on File Prior to Encounter  Medication Sig  . cetirizine (ZYRTEC) 10 MG tablet Take 1 tablet (10 mg total) by mouth daily.  . fluticasone (FLONASE) 50 MCG/ACT nasal spray Place 1 spray into both nostrils 2 (two) times daily.  Marland Kitchen guaiFENesin (MUCINEX) 600 MG 12 hr tablet Take 1 tablet (600 mg total) by mouth 2 (two) times  daily.  . meloxicam (MOBIC) 15 MG tablet Take 1 tablet (15 mg total) by mouth daily.  . naproxen (NAPROSYN) 500 MG tablet Take 1 tablet (500 mg total) by mouth 2 (two) times daily with a meal.  . oxyCODONE-acetaminophen (ROXICET) 5-325 MG tablet Take 1-2 tablets by mouth every 4 (four) hours as needed for severe pain.    FAMILY HISTORY:  Her has no family status information on file.   SOCIAL HISTORY: She  reports that she has been smoking.  She does not have any smokeless tobacco history on file. She reports that she does not drink alcohol.  REVIEW OF SYSTEMS:   unable  SUBJECTIVE:    VITAL SIGNS: Ht 5\' 1"  (1.549 m)  Wt 52.6 kg (115 lb 15.4 oz)  BMI 21.92 kg/m2  SpO2 100%  HEMODYNAMICS:    VENTILATOR SETTINGS: Vent Mode:  [-] PRVC FiO2 (%):  [100 %] 100 % Set Rate:  [16 bmp] 16 bmp Vt Set:  [390 mL] 390 mL PEEP:  [5 cmH20] 5 cmH20 Plateau Pressure:  [11 cmH20] 11 cmH20  INTAKE / OUTPUT:    PHYSICAL EXAMINATION: General:  Female of normal body habitus in NAD Neuro:  Obtunded on vent. Withdrawals from pain. Gaze fixed.  HEENT:  /AT, no JVD Cardiovascular:  RRR, no MRG Lungs:  Clear bilateral breath sounds Abdomen:  Soft, non-distended Musculoskeletal:  No acute deformity Skin:  Grossly intact  LABS:  BMET  Recent Labs Lab 09/02/15 1532 09/02/15  1546  NA 140 143  K 4.2 4.4  CL 106 105  CO2 24  --   BUN 15 23*  CREATININE 0.85 0.70  GLUCOSE 142* 141*    Electrolytes  Recent Labs Lab 09/02/15 1532  CALCIUM 9.9    CBC  Recent Labs Lab 09/02/15 1532 09/02/15 1546  WBC 20.1*  --   HGB 13.8 15.3*  HCT 41.6 45.0  PLT 249  --     Coag's  Recent Labs Lab 09/02/15 1532  APTT 23*  INR 1.04    Sepsis Markers No results for input(s): LATICACIDVEN, PROCALCITON, O2SATVEN in the last 168 hours.  ABG  Recent Labs Lab 09/02/15 1644  PHART 7.254*  PCO2ART 40.2  PO2ART 50.0*    Liver Enzymes  Recent Labs Lab 09/02/15 1532  AST  37  ALT 20  ALKPHOS 72  BILITOT 0.8  ALBUMIN 4.1    Cardiac Enzymes No results for input(s): TROPONINI, PROBNP in the last 168 hours.  Glucose  Recent Labs Lab 09/02/15 1503  GLUCAP 146*    Imaging Dg Chest Portable 1 View  09/02/2015  CLINICAL DATA:  50 year old female with intracranial hemorrhage. Intubation. EXAM: PORTABLE CHEST 1 VIEW COMPARISON:  05/09/2015 FINDINGS: An endotracheal tube is noted with tip 3.8 cm above the carina. An NG tube is present with tip overlying proximal stomach. The side hole overlies the distal esophagus. There is no evidence of focal airspace disease, pulmonary edema, suspicious pulmonary nodule/mass, pleural effusion, or pneumothorax. No acute bony abnormalities are identified. IMPRESSION: Support apparatus described. No evidence of acute cardiopulmonary disease. Electronically Signed   By: Margarette Canada M.D.   On: 09/02/2015 16:38   Ct Head Code Stroke W/o Cm  09/02/2015  CLINICAL DATA:  Altered mental status and headaches, unresponsive EXAM: CT HEAD WITHOUT CONTRAST TECHNIQUE: Contiguous axial images were obtained from the base of the skull through the vertex without intravenous contrast. COMPARISON:  03/31/2015 FINDINGS: Bony calvarium is intact. No gross soft tissue abnormality is seen. In the right frontal lobe there is a large intraparenchymal hemorrhage with some surrounding white matter edema. The hemorrhage measures approximately 6.1 x 4.3 cm in greatest AP and transverse dimensions. Additionally it measures approximately 4.3 cm in craniocaudad projection. Adjacent to this area of parenchymal hemorrhage and somewhat encased by the hemorrhage is a a rounded soft tissue mass lesion which measures 4.1 x 3.2 cm and it also demonstrates some significant white matter edema and is highly suspicious for underlying metastatic disease. Significant midline shift is noted from right to left of approximately 12-13 mm. A small amount of intraventricular component of  the hemorrhage is noted in the lateral, third and fourth ventricles. IMPRESSION: Right frontal intraparenchymal hematoma as described. Some intraventricular extension is seen as well as significant midline shift from right to left. Rounded soft tissue lesion with some surrounding white matter edema which is somewhat encased by the parenchymal hematoma and is highly suggestive of metastatic disease. This lesion is new from the prior exam. Critical Value/emergent results were called by telephone at the time of interpretation on 09/02/2015 at 3:26 pm to Dr. Vanita Panda, who verbally acknowledged these results. Electronically Signed   By: Inez Catalina M.D.   On: 09/02/2015 15:25    STUDIES:  CT head 7/13 > Right frontal intraparenchymal hematoma as described. Some intraventricular extension is seen as well as significant midline shift from right to left. Rounded soft tissue lesion with some surrounding white matter edema which is somewhat encased by the  parenchymal hematoma and is highly suggestive of metastatic disease. This lesion is new from the prior exam.  CULTURES:   ANTIBIOTICS:   SIGNIFICANT EVENTS: 7/13 admit  LINES/TUBES: ETT 7/13 >  DISCUSSION: 50 year old female with PMH HTN, DM with headache for several months. Found unresponsive by husband and noted to have large L[place] with concern for malignancy. Admitted to neurology and ICU. Plan to support with mechanical ventilation and tight BP control. Neurosurgical consult pending.   ASSESSMENT / PLAN:  PULMONARY A: Inability to protect airway secondary to ICH H/o Asthma  P:   Full vent support ABG CXR in AM Vent bundle PRN albuterol nebs  CARDIOVASCULAR A:  H/o HTN  P:  Telemetry BP parameters per neurology PRN hydralazine Check troponin  RENAL A:   No acute issues  P:   Follow BMP  GASTROINTESTINAL A:   No acute issues  P:   NPO for now Pepcid for SUP  HEMATOLOGIC A:   No acute issues  P:  Follow  CBC SCDs  INFECTIOUS A:   Leukocytosis  P:   Monitor off ABX Trend WBC and fever curve  ENDOCRINE A:   DM  P:   CBG monitoring and SSI q 4hours Check A1c  NEUROLOGIC A:   Large R frontal intraparenchymal hemorrhage with intraventricular extension Soft tissue mass R frontal which appears to be focus of bleed   P:   RASS goal: 0 Propofol for ICU sedation/comfort/agitation Neurology primary Neurosurgery consultation pending.    FAMILY  - Updates: Husband updated by BQ in ED  - Inter-disciplinary family meet or Palliative Care meeting due by:  7/20   Georgann Housekeeper, AGACNP-BC [facility] Pager [phone] or [phone]  09/02/2015 5:42 PM

## 2015-09-02 NOTE — Consult Note (Signed)
Attending:  I have seen and examined the patient with Jo Housekeeper NP and agree with his note above.  We formulated the plan together and I elicited the following history.    Jo Johnston has been complaining of a headache for a few weeks, severely worsened yewsterday, then became unresponsive.  Found to have large intracerebral bleed.  INtubated.  PCCM consulted for mechanical ventilation.  On exam Lungs clear, vent supported breaths Neuro: cough, gag intact, withdraws to pain in hands bilaterally  CT head > large frontal cortex hemorrhage, ? Mass?  CXR > normal pulmonary parenchyma  Impression/plan Acute intracranial hemorrhage> per neurology/neurosurgery, we are happy to place a central line for hypertonic saline if felt appropriate by primary service Acute respiratory failure with hypoxemia due to inability to protect airway> continue full vent support, continue VAP bundle.  Rest as per NP note  My cc time 35 minutes  I updated husband bedside  Jo Awkward, MD [facility] Pager: [phone] Cell: [phone] After 3pm or if no response, call [phone]

## 2015-09-02 NOTE — ED Provider Notes (Signed)
CSN: [id number]     Arrival date & time 09/02/15  1459 History   None    Chief Complaint  Patient presents with  . Altered Mental Status     (Consider location/radiation/quality/duration/timing/severity/associated sxs/prior Treatment) Patient is a 50 y.o. female presenting with altered mental status. The history is provided by the patient.  Altered Mental Status Presenting symptoms: confusion and unresponsiveness   Severity:  Severe Most recent episode:  Today Episode history:  Continuous Timing:  Constant Progression:  Unchanged Chronicity:  New Context: not dementia and not a nursing home resident     Past Medical History  Diagnosis Date  . Hypertension   . Diabetes mellitus without complication ([place])   . Asthma   . Bipolar 1 disorder [facility])    Past Surgical History  Procedure Laterality Date  . Breast excisional biopsy Right 15+ yrs ago    LARGE SEBACEOUS CYST   Family History  Problem Relation Age of Onset  . Breast cancer Mother    Social History  Substance Use Topics  . Smoking status: Current Some Day Smoker  . Smokeless tobacco: None  . Alcohol Use: No   OB History    No data available     Review of Systems  Unable to perform ROS: Mental status change  Psychiatric/Behavioral: Positive for confusion.    Allergies  Haloperidol; Depakote; Lithium; Risperdal; and Seroquel  Home Medications   Prior to Admission medications   Medication Sig Start Date End Date Taking? Authorizing Provider  cetirizine (ZYRTEC) 10 MG tablet Take 1 tablet (10 mg total) by mouth daily. 03/11/15   Charline Bills Cuthriell, PA-C  fluticasone (FLONASE) 50 MCG/ACT nasal spray Place 1 spray into both nostrils 2 (two) times daily. 03/11/15   Charline Bills Cuthriell, PA-C  guaiFENesin (MUCINEX) 600 MG 12 hr tablet Take 1 tablet (600 mg total) by mouth 2 (two) times daily. 03/31/15 03/30/16  Pierce Crane Beers, PA-C  meloxicam (MOBIC) 15 MG tablet Take 1 tablet (15 mg total) by mouth daily.  05/09/15   Loney Hering, MD  naproxen (NAPROSYN) 500 MG tablet Take 1 tablet (500 mg total) by mouth 2 (two) times daily with a meal. 03/31/15   Arlyss Repress, PA-C  oxyCODONE-acetaminophen (ROXICET) 5-325 MG tablet Take 1-2 tablets by mouth every 4 (four) hours as needed for severe pain. 03/31/15   Pierce Crane Beers, PA-C   BP 128/73 mmHg  Pulse 67  Resp 21  Ht 5\' 1"  (1.549 m)  Wt 52.6 kg  BMI 21.92 kg/m2  SpO2 100% Physical Exam  Constitutional: She appears distressed.  HENT:  Head: Normocephalic and atraumatic.  Eyes: Pupils are equal, round, and reactive to light.  Fixed gaze to the right.    Neck: Normal range of motion.  Cardiovascular: Normal rate.   Pulmonary/Chest: No respiratory distress.  Abdominal: Soft. She exhibits no distension.  Musculoskeletal: She exhibits no edema.  Neurological: GCS eye subscore is 2. GCS verbal subscore is 2. GCS motor subscore is 6.  Fixed pronation of bilateral feet.  Flexed RUE  Skin: Skin is warm and dry. No pallor.    ED Course  .Intubation Date/Time: 09/02/2015 5:53 PM Performed by: Geronimo Boot Authorized by: Blanchie Dessert Consent: The procedure was performed in an emergent situation. Time out: Immediately prior to procedure a "time out" was called to verify the correct patient, procedure, equipment, support staff and site/side marked as required. Indications: airway protection Intubation method: direct Patient status: paralyzed (RSI) Preoxygenation: [place]. Sedatives: etomidate  Paralytic: succinylcholine Laryngoscope size: Mac 3 Tube size: 7.0 mm Tube type: cuffed Number of attempts: 1 Cricoid pressure: no Cords visualized: yes Post-procedure assessment: chest rise and ETCO2 monitor Breath sounds: equal and absent over the epigastrium Cuff inflated: yes ETT to lip: 22 cm Tube secured with: ETT holder Chest x-ray findings: endotracheal tube in appropriate position Patient tolerance: Patient tolerated the procedure  well with no immediate complications   (including critical care time) Labs Review Labs Reviewed  APTT - Abnormal; Notable for the following:    aPTT 23 (*)    All other components within normal limits  CBC - Abnormal; Notable for the following:    WBC 20.1 (*)    All other components within normal limits  DIFFERENTIAL - Abnormal; Notable for the following:    Neutro Abs 16.7 (*)    Monocytes Absolute 1.1 (*)    All other components within normal limits  COMPREHENSIVE METABOLIC PANEL - Abnormal; Notable for the following:    Glucose, Bld 142 (*)    All other components within normal limits  ACETAMINOPHEN LEVEL - Abnormal; Notable for the following:    Acetaminophen (Tylenol), Serum <10 (*)    All other components within normal limits  I-STAT CHEM 8, ED - Abnormal; Notable for the following:    BUN 23 (*)    Glucose, Bld 141 (*)    Calcium, Ion 1.12 (*)    Hemoglobin 15.3 (*)    All other components within normal limits  CBG MONITORING, ED - Abnormal; Notable for the following:    Glucose-Capillary 146 (*)    All other components within normal limits  I-STAT ARTERIAL BLOOD GAS, ED - Abnormal; Notable for the following:    pH, Arterial 7.254 (*)    pO2, Arterial 50.0 (*)    Bicarbonate 17.8 (*)    Acid-base deficit 9.0 (*)    All other components within normal limits  ETHANOL  PROTIME-INR  SALICYLATE LEVEL  URINE RAPID DRUG SCREEN, HOSP PERFORMED  URINALYSIS, ROUTINE W REFLEX MICROSCOPIC (NOT AT [facility])  URINE RAPID DRUG SCREEN, HOSP PERFORMED  BLOOD GAS, ARTERIAL  TRIGLYCERIDES  I-STAT TROPOININ, ED    Imaging Review Dg Chest Portable 1 View  09/02/2015  CLINICAL DATA:  50 year old female with intracranial hemorrhage. Intubation. EXAM: PORTABLE CHEST 1 VIEW COMPARISON:  05/09/2015 FINDINGS: An endotracheal tube is noted with tip 3.8 cm above the carina. An NG tube is present with tip overlying proximal stomach. The side hole overlies the distal esophagus. There is no  evidence of focal airspace disease, pulmonary edema, suspicious pulmonary nodule/mass, pleural effusion, or pneumothorax. No acute bony abnormalities are identified. IMPRESSION: Support apparatus described. No evidence of acute cardiopulmonary disease. Electronically Signed   By: Margarette Canada M.D.   On: 09/02/2015 16:38   Ct Head Code Stroke W/o Cm  09/02/2015  CLINICAL DATA:  Altered mental status and headaches, unresponsive EXAM: CT HEAD WITHOUT CONTRAST TECHNIQUE: Contiguous axial images were obtained from the base of the skull through the vertex without intravenous contrast. COMPARISON:  03/31/2015 FINDINGS: Bony calvarium is intact. No gross soft tissue abnormality is seen. In the right frontal lobe there is a large intraparenchymal hemorrhage with some surrounding white matter edema. The hemorrhage measures approximately 6.1 x 4.3 cm in greatest AP and transverse dimensions. Additionally it measures approximately 4.3 cm in craniocaudad projection. Adjacent to this area of parenchymal hemorrhage and somewhat encased by the hemorrhage is a a rounded soft tissue mass lesion which measures 4.1 x  3.2 cm and it also demonstrates some significant white matter edema and is highly suspicious for underlying metastatic disease. Significant midline shift is noted from right to left of approximately 12-13 mm. A small amount of intraventricular component of the hemorrhage is noted in the lateral, third and fourth ventricles. IMPRESSION: Right frontal intraparenchymal hematoma as described. Some intraventricular extension is seen as well as significant midline shift from right to left. Rounded soft tissue lesion with some surrounding white matter edema which is somewhat encased by the parenchymal hematoma and is highly suggestive of metastatic disease. This lesion is new from the prior exam. Critical Value/emergent results were called by telephone at the time of interpretation on 09/02/2015 at 3:26 pm to Dr. Vanita Panda, who  verbally acknowledged these results. Electronically Signed   By: Inez Catalina M.D.   On: 09/02/2015 15:25   I have personally reviewed and evaluated these images and lab results as part of my medical decision-making.   EKG Interpretation   Date/Time:  Thursday September 02 2015 15:05:04 EDT Ventricular Rate:  72 PR Interval:    QRS Duration: 88 QT Interval:  413 QTC Calculation: 452 R Axis:   93 Text Interpretation:  Sinus rhythm Atrial premature complexes in couplets  Borderline right axis deviation No significant change since last tracing  Confirmed by Maryan Rued  MD, Loree Fee ([id number]) on 09/02/2015 3:26:16 PM Also  confirmed by Maryan Rued  MD, WHITNEY ([id number]), editor Stout CT, Leda Gauze  [phone])  on 09/02/2015 4:33:55 PM      MDM   Final diagnoses:  Nontraumatic cortical hemorrhage of right cerebral hemisphere [facility])    The pt is a 50 yo female with a hx of asthma, bipolar Disorder presenting today for altered mental status from home. Family reports they last saw her last night when she had sudden onset of headache with nausea and vomiting. Subsequently went to bed and did not get up in the morning. They found her altered in bed.  On arrival to the emergency department right-sided fixed gaze with flexion of the right upper extremity and a GCS of 10. CT had emergently performed showing a right-sided intraparenchymal hemorrhage with possible underlying metastatic disease. Neurology was consulted who recommended intubation. The patient subsequently performed performed as above.  CBC with leukocytosis likely 2/2 stress response.  Etoh, INR, trop, BMP unremarkable.  Critical care was consulted and evaluated the pt in the ED.  [facility] consulted but did not discuss patient with the service.  On reevaluation pt on propfol gtt, head of bed elevated and head midline with EtCO2 at 35.   Neurology given [facility] in the ED and pt subsequently admitted to ICU.   Labs and images were viewed by myself and  incorporated into medical decision making.  Discussed pertinent finding with patient or caregiver prior to admission with no further questions.  Pt care supervised by my attending Dr. Maryan Rued.   Geronimo Boot, MD PGY-3 Emergency Medicine        Geronimo Boot, MD 09/02/15 1805  Blanchie Dessert, MD 09/03/15 2132

## 2015-09-02 NOTE — Progress Notes (Signed)
   09/02/15 1800  Clinical Encounter Type  Visited With Family  Visit Type ED;Spiritual support  Referral From Nurse  Spiritual Encounters  Spiritual Needs Prayer;Emotional  Chaplain received page from ED to provide comfort to husband of patient, who had just received bad news regarding a brain bleed. Chaplain prayed with husband and when son and son's wife arrived escorted all to 16M waiting room. They left to retrieve things from their car after chaplain informed them it would be 30-40 minutes. Both husband and son highly tearful. Champion Corales, Chaplain

## 2015-09-02 NOTE — Consult Note (Signed)
Reason for Consult: Right frontal intracerebral hemorrhage, nontraumatic Referring Physician: Nursing  Jo Johnston is an 50 y.o. female.  HPI: I was contacted by the ICU nurses for evaluation of this patient. They state that neurosurgery had been contacted regarding this patient and her carried been discussed (this is by report from neurology apparently). I have not received any contact regarding this patient but will offer my opinion. Should her care have been discussed with one of my partners then of course I would defer to them.  The patient is a 50 year old female who was brought by family to the emergency department for evaluation of rapid onset of unconsciousness.  No history of trauma. No known vascular disease or history of cancer.  No other history available to me at this time. The patient has remained hemodynamically stable. She has been intubated. She is receiving IV mannitol.  Past Medical History  Diagnosis Date  . Hypertension   . Diabetes mellitus without complication ([place])   . Asthma   . Bipolar 1 disorder [facility])     Past Surgical History  Procedure Laterality Date  . Breast excisional biopsy Right 15+ yrs ago    LARGE SEBACEOUS CYST    Family History  Problem Relation Age of Onset  . Breast cancer Mother     Social History:  reports that she has been smoking.  She does not have any smokeless tobacco history on file. She reports that she does not drink alcohol. Her drug history is not on file.  Allergies:  Allergies  Allergen Reactions  . Haloperidol Hives  . Depakote [Valproic Acid] Other (See Comments)    Headache  . Lithium Hives  . Risperdal [Risperidone] Other (See Comments)    Headache  . Seroquel [Quetiapine Fumarate] Other (See Comments)    Headache    Medications: I have reviewed the patient's current medications.  Results for orders placed or performed during the hospital encounter of 09/02/15 (from the past 48 hour(s))  CBG monitoring,  ED     Status: Abnormal   Collection Time: 09/02/15  3:03 PM  Result Value Ref Range   Glucose-Capillary 146 (H) 65 - 99 mg/dL  Ethanol     Status: None   Collection Time: 09/02/15  3:32 PM  Result Value Ref Range   Alcohol, Ethyl (B) <5 <5 mg/dL    Comment:        LOWEST DETECTABLE LIMIT FOR SERUM ALCOHOL IS 5 mg/dL FOR MEDICAL PURPOSES ONLY   Protime-INR     Status: None   Collection Time: 09/02/15  3:32 PM  Result Value Ref Range   Prothrombin Time 13.8 11.6 - 15.2 seconds   INR 1.04 0.00 - 1.49  APTT     Status: Abnormal   Collection Time: 09/02/15  3:32 PM  Result Value Ref Range   aPTT 23 (L) 24 - 37 seconds  CBC     Status: Abnormal   Collection Time: 09/02/15  3:32 PM  Result Value Ref Range   WBC 20.1 (H) 4.0 - 10.5 K/uL   RBC 5.10 3.87 - 5.11 MIL/uL   Hemoglobin 13.8 12.0 - 15.0 g/dL   HCT 41.6 36.0 - 46.0 %   MCV 81.6 78.0 - 100.0 fL   MCH 27.1 26.0 - 34.0 pg   MCHC 33.2 30.0 - 36.0 g/dL   RDW 13.4 11.5 - 15.5 %   Platelets 249 150 - 400 K/uL  Differential     Status: Abnormal   Collection Time: 09/02/15  3:32 PM  Result Value Ref Range   Neutrophils Relative % 82 %   Neutro Abs 16.7 (H) 1.7 - 7.7 K/uL   Lymphocytes Relative 12 %   Lymphs Abs 2.3 0.7 - 4.0 K/uL   Monocytes Relative 6 %   Monocytes Absolute 1.1 (H) 0.1 - 1.0 K/uL   Eosinophils Relative 0 %   Eosinophils Absolute 0.0 0.0 - 0.7 K/uL   Basophils Relative 0 %   Basophils Absolute 0.0 0.0 - 0.1 K/uL  Comprehensive metabolic panel     Status: Abnormal   Collection Time: 09/02/15  3:32 PM  Result Value Ref Range   Sodium 140 135 - 145 mmol/L   Potassium 4.2 3.5 - 5.1 mmol/L   Chloride 106 101 - 111 mmol/L   CO2 24 22 - 32 mmol/L   Glucose, Bld 142 (H) 65 - 99 mg/dL   BUN 15 6 - 20 mg/dL   Creatinine, Ser 0.85 0.44 - 1.00 mg/dL   Calcium 9.9 8.9 - 10.3 mg/dL   Total Protein 7.5 6.5 - 8.1 g/dL   Albumin 4.1 3.5 - 5.0 g/dL   AST 37 15 - 41 U/L   ALT 20 14 - 54 U/L   Alkaline Phosphatase  72 38 - 126 U/L   Total Bilirubin 0.8 0.3 - 1.2 mg/dL   GFR calc non Af Amer >60 >60 mL/min   GFR calc Af Amer >60 >60 mL/min    Comment: (NOTE) The eGFR has been calculated using the CKD EPI equation. This calculation has not been validated in all clinical situations. eGFR's persistently <60 mL/min signify possible Chronic Kidney Disease.    Anion gap 10 5 - 15  Acetaminophen level     Status: Abnormal   Collection Time: 09/02/15  3:32 PM  Result Value Ref Range   Acetaminophen (Tylenol), Serum <10 (L) 10 - 30 ug/mL    Comment:        THERAPEUTIC CONCENTRATIONS VARY SIGNIFICANTLY. A RANGE OF 10-30 ug/mL MAY BE AN EFFECTIVE CONCENTRATION FOR MANY PATIENTS. HOWEVER, SOME ARE BEST TREATED AT CONCENTRATIONS OUTSIDE THIS RANGE. ACETAMINOPHEN CONCENTRATIONS >150 ug/mL AT 4 HOURS AFTER INGESTION AND >50 ug/mL AT 12 HOURS AFTER INGESTION ARE OFTEN ASSOCIATED WITH TOXIC REACTIONS.   Salicylate level     Status: None   Collection Time: 09/02/15  3:32 PM  Result Value Ref Range   Salicylate Lvl <0.1 2.8 - 30.0 mg/dL  I-stat troponin, ED (not at [facility], [facility])     Status: None   Collection Time: 09/02/15  3:44 PM  Result Value Ref Range   Troponin i, poc 0.07 0.00 - 0.08 ng/mL   Comment 3            Comment: Due to the release kinetics of cTnI, a negative result within the first hours of the onset of symptoms does not rule out myocardial infarction with certainty. If myocardial infarction is still suspected, repeat the test at appropriate intervals.   I-Stat Chem 8, ED  (not at [facility], [facility])     Status: Abnormal   Collection Time: 09/02/15  3:46 PM  Result Value Ref Range   Sodium 143 135 - 145 mmol/L   Potassium 4.4 3.5 - 5.1 mmol/L   Chloride 105 101 - 111 mmol/L   BUN 23 (H) 6 - 20 mg/dL   Creatinine, Ser 0.70 0.44 - 1.00 mg/dL   Glucose, Bld 141 (H) 65 - 99 mg/dL   Calcium, Ion 1.12 (L) 1.13 - 1.30 mmol/L  TCO2 25 0 - 100 mmol/L   Hemoglobin 15.3 (H) 12.0 - 15.0 g/dL    HCT 45.0 36.0 - 46.0 %  I-Stat Arterial Blood Gas, ED - (order at [facility] and [facility] only)     Status: Abnormal   Collection Time: 09/02/15  4:44 PM  Result Value Ref Range   pH, Arterial 7.254 (L) 7.350 - 7.450   pCO2 arterial 40.2 35.0 - 45.0 mmHg   pO2, Arterial 50.0 (L) 80.0 - 100.0 mmHg   Bicarbonate 17.8 (L) 20.0 - 24.0 mEq/L   TCO2 19 0 - 100 mmol/L   O2 Saturation 79.0 %   Acid-base deficit 9.0 (H) 0.0 - 2.0 mmol/L   Patient temperature 98.6 F    Collection site RADIAL, ALLEN'S TEST ACCEPTABLE    Drawn by Operator    Sample type ARTERIAL   Urine rapid drug screen (hosp performed)not at [facility]     Status: None   Collection Time: 09/02/15  5:45 PM  Result Value Ref Range   Opiates NONE DETECTED NONE DETECTED   Cocaine NONE DETECTED NONE DETECTED   Benzodiazepines NONE DETECTED NONE DETECTED   Amphetamines NONE DETECTED NONE DETECTED   Tetrahydrocannabinol NONE DETECTED NONE DETECTED   Barbiturates NONE DETECTED NONE DETECTED    Comment:        DRUG SCREEN FOR MEDICAL PURPOSES ONLY.  IF CONFIRMATION IS NEEDED FOR ANY PURPOSE, NOTIFY LAB WITHIN 5 DAYS.        LOWEST DETECTABLE LIMITS FOR URINE DRUG SCREEN Drug Class       Cutoff (ng/mL) Amphetamine      1000 Barbiturate      200 Benzodiazepine   102 Tricyclics       725 Opiates          300 Cocaine          300 THC              50   Urinalysis, Routine w reflex microscopic (not at [facility])     Status: Abnormal   Collection Time: 09/02/15  5:53 PM  Result Value Ref Range   Color, Urine YELLOW YELLOW   APPearance CLEAR CLEAR   Specific Gravity, Urine 1.028 1.005 - 1.030   pH 6.0 5.0 - 8.0   Glucose, UA NEGATIVE NEGATIVE mg/dL   Hgb urine dipstick MODERATE (A) NEGATIVE   Bilirubin Urine NEGATIVE NEGATIVE   Ketones, ur NEGATIVE NEGATIVE mg/dL   Protein, ur 100 (A) NEGATIVE mg/dL   Nitrite NEGATIVE NEGATIVE   Leukocytes, UA NEGATIVE NEGATIVE  Urine microscopic-add on     Status: Abnormal   Collection Time: 09/02/15  5:53 PM   Result Value Ref Range   Squamous Epithelial / LPF 0-5 (A) NONE SEEN   WBC, UA 0-5 0 - 5 WBC/hpf   RBC / HPF 0-5 0 - 5 RBC/hpf   Bacteria, UA RARE (A) NONE SEEN   Casts HYALINE CASTS (A) NEGATIVE    Comment: GRANULAR CAST   Urine-Other MUCOUS PRESENT   I-STAT 3, arterial blood gas (G3+)     Status: Abnormal   Collection Time: 09/02/15  6:41 PM  Result Value Ref Range   pH, Arterial 7.452 (H) 7.350 - 7.450   pCO2 arterial 35.5 35.0 - 45.0 mmHg   pO2, Arterial 232.0 (H) 80.0 - 100.0 mmHg   Bicarbonate 24.8 (H) 20.0 - 24.0 mEq/L   TCO2 26 0 - 100 mmol/L   O2 Saturation 100.0 %   Acid-Base Excess 1.0 0.0 - 2.0  mmol/L   Patient temperature 98.6 F    Collection site RADIAL, ALLEN'S TEST ACCEPTABLE    Drawn by Nurse    Sample type ARTERIAL   Triglycerides     Status: Abnormal   Collection Time: 09/02/15  6:54 PM  Result Value Ref Range   Triglycerides 198 (H) <150 mg/dL  Sodium     Status: None   Collection Time: 09/02/15  6:54 PM  Result Value Ref Range   Sodium 137 135 - 145 mmol/L  Osmolality     Status: Abnormal   Collection Time: 09/02/15  6:54 PM  Result Value Ref Range   Osmolality 308 (H) 275 - 295 mOsm/kg    Dg Chest Portable 1 View  09/02/2015  CLINICAL DATA:  50 year old female with intracranial hemorrhage. Intubation. EXAM: PORTABLE CHEST 1 VIEW COMPARISON:  05/09/2015 FINDINGS: An endotracheal tube is noted with tip 3.8 cm above the carina. An NG tube is present with tip overlying proximal stomach. The side hole overlies the distal esophagus. There is no evidence of focal airspace disease, pulmonary edema, suspicious pulmonary nodule/mass, pleural effusion, or pneumothorax. No acute bony abnormalities are identified. IMPRESSION: Support apparatus described. No evidence of acute cardiopulmonary disease. Electronically Signed   By: Margarette Canada M.D.   On: 09/02/2015 16:38   Ct Head Code Stroke W/o Cm  09/02/2015  CLINICAL DATA:  Altered mental status and headaches,  unresponsive EXAM: CT HEAD WITHOUT CONTRAST TECHNIQUE: Contiguous axial images were obtained from the base of the skull through the vertex without intravenous contrast. COMPARISON:  03/31/2015 FINDINGS: Bony calvarium is intact. No gross soft tissue abnormality is seen. In the right frontal lobe there is a large intraparenchymal hemorrhage with some surrounding white matter edema. The hemorrhage measures approximately 6.1 x 4.3 cm in greatest AP and transverse dimensions. Additionally it measures approximately 4.3 cm in craniocaudad projection. Adjacent to this area of parenchymal hemorrhage and somewhat encased by the hemorrhage is a a rounded soft tissue mass lesion which measures 4.1 x 3.2 cm and it also demonstrates some significant white matter edema and is highly suspicious for underlying metastatic disease. Significant midline shift is noted from right to left of approximately 12-13 mm. A small amount of intraventricular component of the hemorrhage is noted in the lateral, third and fourth ventricles. IMPRESSION: Right frontal intraparenchymal hematoma as described. Some intraventricular extension is seen as well as significant midline shift from right to left. Rounded soft tissue lesion with some surrounding white matter edema which is somewhat encased by the parenchymal hematoma and is highly suggestive of metastatic disease. This lesion is new from the prior exam. Critical Value/emergent results were called by telephone at the time of interpretation on 09/02/2015 at 3:26 pm to Dr. Vanita Panda, who verbally acknowledged these results. Electronically Signed   By: Inez Catalina M.D.   On: 09/02/2015 15:25    Review of systems not obtained due to patient factors. Blood pressure 137/94, pulse 83, temperature 90.7 F (32.6 C), resp. rate 22, height 5' 1"  (1.549 m), weight 51.2 kg (112 lb 14 oz), SpO2 100 %. The patient is supine in bed in the ICU. She is intubated and ventilated. She will arouse with some  minimal eye opening to noxious stimuli. She is purposeful with her right upper extremity. She flexes both lower extremities. She has some abnormal flexion of her left upper extremity. Her pupils are equal at 3 mm. Corneal reflexes, gag reflex and cough reflexes are intact. Examination her head ears eyes and  throat is unremarkable. Chest and abdomen benign.  Assessment/Plan: Large right frontal intracerebral hemorrhage is extension into her right hypothalamus. I agree that there is an isodense lesion which is worrisome for an associated neoplasm. Although she has mass effect and has some extension of her hemorrhage into her intraventricular system there is no evidence of hydrocephalus nor is there evidence of herniation on her scan. I agree with the plan of pinning an MRI scan for better evaluation of this lesion. I agree with moderate hyperventilation, ICU observation and intermittent mannitol. I will follow along over time. I currently do not see indication for emergent surgical intervention.  Damani Rando A 09/02/2015, 7:57 PM

## 2015-09-02 NOTE — Progress Notes (Signed)
eLink Physician-Brief Progress Note Patient Name: Jo Johnston DOB: 1965-07-07 MRN: [id number]   Date of Service  09/02/2015  HPI/Events of Note  Hypothermic at 32.5  eICU Interventions  Warming blanket.      Intervention Category Evaluation Type: Other  [place] 09/02/2015, 7:34 PM
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
## 2015-09-03 ENCOUNTER — Inpatient Hospital Stay (HOSPITAL_COMMUNITY): Payer: Medicaid Other

## 2015-09-03 ENCOUNTER — Encounter (HOSPITAL_COMMUNITY)
Admission: EM | Disposition: A | Discharge status: Skilled Nursing Facility | Payer: Self-pay | Source: Home / Self Care | Attending: Neurosurgery

## 2015-09-03 ENCOUNTER — Inpatient Hospital Stay (HOSPITAL_COMMUNITY): Payer: Medicaid Other | Admitting: Certified Registered"

## 2015-09-03 ENCOUNTER — Encounter (HOSPITAL_COMMUNITY): Payer: Self-pay | Admitting: Anesthesiology

## 2015-09-03 DIAGNOSIS — G939 Disorder of brain, unspecified: Secondary | ICD-10-CM

## 2015-09-03 DIAGNOSIS — J9601 Acute respiratory failure with hypoxia: Secondary | ICD-10-CM

## 2015-09-03 HISTORY — PX: CRANIOTOMY: SHX93

## 2015-09-03 LAB — BASIC METABOLIC PANEL
Anion gap: 7 (ref 5–15)
BUN: 18 mg/dL (ref 6–20)
CALCIUM: 8.4 mg/dL — AB (ref 8.9–10.3)
CO2: 23 mmol/L (ref 22–32)
CREATININE: 0.86 mg/dL (ref 0.44–1.00)
Chloride: 111 mmol/L (ref 101–111)
GFR calc non Af Amer: >60 mL/min (ref >60)
Glucose, Bld: 131 mg/dL — ABNORMAL HIGH (ref 65–99)
Potassium: 3.7 mmol/L (ref 3.5–5.1)
SODIUM: 141 mmol/L (ref 135–145)

## 2015-09-03 LAB — TYPE AND SCREEN
ABO/RH(D): A POS
ANTIBODY SCREEN: NEGATIVE

## 2015-09-03 LAB — ABO/RH: ABO/RH(D): A POS

## 2015-09-03 LAB — SODIUM
SODIUM: 142 mmol/L (ref 135–145)
Sodium: 139 mmol/L (ref 135–145)
Sodium: 145 mmol/L (ref 135–145)

## 2015-09-03 LAB — OSMOLALITY
OSMOLALITY: 310 mosm/kg — AB (ref 275–295)
Osmolality: 305 mOsm/kg — ABNORMAL HIGH (ref 275–295)
Osmolality: 309 mOsm/kg — ABNORMAL HIGH (ref 275–295)

## 2015-09-03 LAB — CBC
HEMATOCRIT: 34.9 % — AB (ref 36.0–46.0)
Hemoglobin: 11.2 g/dL — ABNORMAL LOW (ref 12.0–15.0)
MCH: 26.9 pg (ref 26.0–34.0)
MCHC: 32.1 g/dL (ref 30.0–36.0)
MCV: 83.9 fL (ref 78.0–100.0)
Platelets: 202 10*3/uL (ref 150–400)
RBC: 4.16 MIL/uL (ref 3.87–5.11)
RDW: 14 % (ref 11.5–15.5)
WBC: 12.5 10*3/uL — ABNORMAL HIGH (ref 4.0–10.5)

## 2015-09-03 LAB — GLUCOSE, CAPILLARY
GLUCOSE-CAPILLARY: 128 mg/dL — AB (ref 65–99)
GLUCOSE-CAPILLARY: 138 mg/dL — AB (ref 65–99)
Glucose-Capillary: 133 mg/dL — ABNORMAL HIGH (ref 65–99)
Glucose-Capillary: 133 mg/dL — ABNORMAL HIGH (ref 65–99)
Glucose-Capillary: 132 mg/dL — ABNORMAL HIGH (ref 65–99)
Glucose-Capillary: 149 mg/dL — ABNORMAL HIGH (ref 65–99)

## 2015-09-03 SURGERY — CRANIOTOMY TUMOR EXCISION
Anesthesia: General | Laterality: Right

## 2015-09-03 MED ORDER — SODIUM CHLORIDE 0.9 % IR SOLN
Status: DC | PRN
  Administered 2015-09-03: 14:00:00

## 2015-09-03 MED ORDER — POTASSIUM CHLORIDE IN NACL 20-0.9 MEQ/L-% IV SOLN
INTRAVENOUS | Status: DC
  Administered 2015-09-03: 75 mL/h via INTRAVENOUS
  Administered 2015-09-04 – 2015-09-10 (×9): via INTRAVENOUS
  Filled 2015-09-03 (×24): qty 1000

## 2015-09-03 MED ORDER — PROPOFOL 500 MG/50ML IV EMUL
INTRAVENOUS | Status: DC | PRN
  Administered 2015-09-03: 50 ug/kg/min via INTRAVENOUS

## 2015-09-03 MED ORDER — ROCURONIUM BROMIDE 100 MG/10ML IV SOLN
INTRAVENOUS | Status: DC | PRN
  Administered 2015-09-03: 30 mg via INTRAVENOUS
  Administered 2015-09-03: 10 mg via INTRAVENOUS

## 2015-09-03 MED ORDER — ONDANSETRON HCL 4 MG PO TABS: 4.0000 mg | ORAL_TABLET | ORAL | Status: DC | PRN

## 2015-09-03 MED ORDER — SODIUM CHLORIDE 0.9 % IV SOLN
INTRAVENOUS | Status: DC | PRN
  Administered 2015-09-03: 13:00:00 via INTRAVENOUS

## 2015-09-03 MED ORDER — GADOBENATE DIMEGLUMINE 529 MG/ML IV SOLN
10.0000 mL | Freq: Once | INTRAVENOUS | Status: AC | PRN
  Administered 2015-09-03: 10 mL via INTRAVENOUS

## 2015-09-03 MED ORDER — FAMOTIDINE IN NACL 20-0.9 MG/50ML-% IV SOLN: 20.0000 mg | Freq: Two times a day (BID) | INTRAVENOUS | Status: DC

## 2015-09-03 MED ORDER — ONDANSETRON HCL 4 MG/2ML IJ SOLN: 4.0000 mg | INTRAMUSCULAR | Status: DC | PRN

## 2015-09-03 MED ORDER — FENTANYL CITRATE (PF) 100 MCG/2ML IJ SOLN
INTRAMUSCULAR | Status: DC | PRN
  Administered 2015-09-03: 100 ug via INTRAVENOUS
  Administered 2015-09-03: 50 ug via INTRAVENOUS
  Administered 2015-09-03: 100 ug via INTRAVENOUS

## 2015-09-03 MED ORDER — SODIUM CHLORIDE 0.9 % IV SOLN
500.0000 mg | Freq: Two times a day (BID) | INTRAVENOUS | Status: DC
  Administered 2015-09-03 – 2015-09-10 (×14): 500 mg via INTRAVENOUS
  Filled 2015-09-03 (×16): qty 5

## 2015-09-03 MED ORDER — PROPOFOL 10 MG/ML IV BOLUS
INTRAVENOUS | Status: DC | PRN
  Administered 2015-09-03 (×2): 75 mg via INTRAVENOUS

## 2015-09-03 MED ORDER — HEMOSTATIC AGENTS (NO CHARGE) OPTIME
TOPICAL | Status: DC | PRN
  Administered 2015-09-03 (×2): 1 via TOPICAL

## 2015-09-03 MED ORDER — 0.9 % SODIUM CHLORIDE (POUR BTL) OPTIME
TOPICAL | Status: DC | PRN
  Administered 2015-09-03: 1000 mL

## 2015-09-03 MED ORDER — CEFAZOLIN SODIUM-DEXTROSE 2-4 GM/100ML-% IV SOLN
2.0000 g | INTRAVENOUS | Status: AC
  Administered 2015-09-03: 2 g via INTRAVENOUS

## 2015-09-03 MED ORDER — ACETAMINOPHEN 325 MG PO TABS
650.0000 mg | ORAL_TABLET | ORAL | Status: DC | PRN
  Administered 2015-09-04 – 2015-09-13 (×3): 650 mg via ORAL
  Filled 2015-09-03 (×4): qty 2

## 2015-09-03 MED ORDER — MIDAZOLAM HCL 2 MG/2ML IJ SOLN
INTRAMUSCULAR | Status: AC
  Filled 2015-09-03: qty 2

## 2015-09-03 MED ORDER — THROMBIN 20000 UNITS EX SOLR
CUTANEOUS | Status: DC | PRN
  Administered 2015-09-03: 14:00:00 via TOPICAL

## 2015-09-03 MED ORDER — DEXAMETHASONE SODIUM PHOSPHATE 4 MG/ML IJ SOLN
8.0000 mg | Freq: Once | INTRAMUSCULAR | Status: AC
  Administered 2015-09-03: 8 mg via INTRAVENOUS
  Filled 2015-09-03: qty 2

## 2015-09-03 MED ORDER — GELATIN ABSORBABLE MT POWD
OROMUCOSAL | Status: DC | PRN
  Administered 2015-09-03: 14:00:00 via TOPICAL

## 2015-09-03 MED ORDER — PROPOFOL 10 MG/ML IV BOLUS
INTRAVENOUS | Status: AC
  Filled 2015-09-03: qty 40

## 2015-09-03 MED ORDER — DEXAMETHASONE SODIUM PHOSPHATE 4 MG/ML IJ SOLN
4.0000 mg | Freq: Four times a day (QID) | INTRAMUSCULAR | Status: DC
  Administered 2015-09-03 – 2015-09-06 (×11): 4 mg via INTRAVENOUS
  Filled 2015-09-03 (×11): qty 1

## 2015-09-03 MED ORDER — LIDOCAINE HCL (CARDIAC) 20 MG/ML IV SOLN
INTRAVENOUS | Status: DC | PRN
  Administered 2015-09-03: 50 mg via INTRAVENOUS

## 2015-09-03 MED ORDER — ACETAMINOPHEN 650 MG RE SUPP
650.0000 mg | RECTAL | Status: DC | PRN
  Administered 2015-09-04: 650 mg via RECTAL
  Filled 2015-09-03: qty 1

## 2015-09-03 MED ORDER — LABETALOL HCL 5 MG/ML IV SOLN
10.0000 mg | INTRAVENOUS | Status: DC | PRN
Start: 2015-09-03 — End: 2015-09-04

## 2015-09-03 MED ORDER — FENTANYL CITRATE (PF) 250 MCG/5ML IJ SOLN
INTRAMUSCULAR | Status: AC
  Filled 2015-09-03: qty 5

## 2015-09-03 MED ORDER — PROMETHAZINE HCL 25 MG PO TABS: 12.5000 mg | ORAL_TABLET | ORAL | Status: DC | PRN

## 2015-09-03 MED ORDER — CEFAZOLIN SODIUM-DEXTROSE 2-4 GM/100ML-% IV SOLN
2.0000 g | Freq: Three times a day (TID) | INTRAVENOUS | Status: AC
  Administered 2015-09-03 – 2015-09-04 (×2): 2 g via INTRAVENOUS
  Filled 2015-09-03 (×2): qty 100

## 2015-09-03 MED ORDER — LIDOCAINE-EPINEPHRINE 1 %-1:100000 IJ SOLN
INTRAMUSCULAR | Status: DC | PRN
  Administered 2015-09-03: 20 mL

## 2015-09-03 SURGICAL SUPPLY — 65 items
BAG DECANTER FOR FLEXI CONT (MISCELLANEOUS) ×3 IMPLANT
BLADE CLIPPER SURG (BLADE) ×3 IMPLANT
BNDG COHESIVE 4X5 TAN NS LF (GAUZE/BANDAGES/DRESSINGS) IMPLANT
BRUSH SCRUB EZ 1% IODOPHOR (MISCELLANEOUS) IMPLANT
BRUSH SCRUB EZ PLAIN DRY (MISCELLANEOUS) ×3 IMPLANT
BUR ACORN 6.0 PRECISION (BURR) ×2 IMPLANT
BUR ACORN 6.0MM PRECISION (BURR) ×1
BUR SPIRAL ROUTER 2.3 (BUR) ×2 IMPLANT
BUR SPIRAL ROUTER 2.3MM (BUR) ×1
CANISTER SUCT 3000ML PPV (MISCELLANEOUS) ×6 IMPLANT
CLIP TI MEDIUM 6 (CLIP) IMPLANT
CONT SPEC 4OZ CLIKSEAL STRL BL (MISCELLANEOUS) ×9 IMPLANT
DRAPE CAMERA VIDEO/LASER (DRAPES) IMPLANT
DRAPE MICROSCOPE LEICA (MISCELLANEOUS) IMPLANT
DRAPE NEUROLOGICAL W/INCISE (DRAPES) ×3 IMPLANT
DRAPE STERI IOBAN 125X83 (DRAPES) IMPLANT
DRAPE SURG 17X23 STRL (DRAPES) IMPLANT
DRAPE WARM FLUID 44X44 (DRAPE) ×3 IMPLANT
ELECT CAUTERY BLADE 6.4 (BLADE) ×3 IMPLANT
ELECT REM PT RETURN 9FT ADLT (ELECTROSURGICAL) ×3
ELECTRODE REM PT RTRN 9FT ADLT (ELECTROSURGICAL) ×1 IMPLANT
GAUZE SPONGE 4X4 12PLY STRL (GAUZE/BANDAGES/DRESSINGS) ×3 IMPLANT
GAUZE SPONGE 4X4 16PLY XRAY LF (GAUZE/BANDAGES/DRESSINGS) IMPLANT
GLOVE ECLIPSE 9.0 STRL (GLOVE) ×3 IMPLANT
GLOVE EXAM NITRILE LRG STRL (GLOVE) IMPLANT
GLOVE EXAM NITRILE MD LF STRL (GLOVE) ×3 IMPLANT
GLOVE EXAM NITRILE XL STR (GLOVE) IMPLANT
GLOVE EXAM NITRILE XS STR PU (GLOVE) IMPLANT
GOWN STRL REUS W/ TWL LRG LVL3 (GOWN DISPOSABLE) ×1 IMPLANT
GOWN STRL REUS W/ TWL XL LVL3 (GOWN DISPOSABLE) ×2 IMPLANT
GOWN STRL REUS W/TWL 2XL LVL3 (GOWN DISPOSABLE) IMPLANT
GOWN STRL REUS W/TWL LRG LVL3 (GOWN DISPOSABLE) ×2
GOWN STRL REUS W/TWL XL LVL3 (GOWN DISPOSABLE) ×4
HEMOSTAT POWDER KIT SURGIFOAM (HEMOSTASIS) ×3 IMPLANT
HEMOSTAT SURGICEL 2X14 (HEMOSTASIS) ×3 IMPLANT
KIT BASIN OR (CUSTOM PROCEDURE TRAY) ×3 IMPLANT
KIT ROOM TURNOVER OR (KITS) ×3 IMPLANT
NEEDLE HYPO 18GX1.5 BLUNT FILL (NEEDLE) IMPLANT
NEEDLE HYPO 25X1 1.5 SAFETY (NEEDLE) ×3 IMPLANT
NS IRRIG 1000ML POUR BTL (IV SOLUTION) ×6 IMPLANT
PACK CRANIOTOMY (CUSTOM PROCEDURE TRAY) ×3 IMPLANT
PAD ARMBOARD 7.5X6 YLW CONV (MISCELLANEOUS) ×9 IMPLANT
PATTIES SURGICAL .25X.25 (GAUZE/BANDAGES/DRESSINGS) IMPLANT
PATTIES SURGICAL .5 X.5 (GAUZE/BANDAGES/DRESSINGS) IMPLANT
PATTIES SURGICAL .5 X3 (DISPOSABLE) IMPLANT
PATTIES SURGICAL 1X1 (DISPOSABLE) IMPLANT
PLATE 1.5  2HOLE LNG NEURO (Plate) ×4 IMPLANT
PLATE 1.5 2HOLE LNG NEURO (Plate) ×2 IMPLANT
PLATE 1.5/0.5 13MM BURR HOLE (Plate) ×3 IMPLANT
PLATE 1.5/0.5 18.5MM BURR HOLE (Plate) ×3 IMPLANT
RUBBERBAND STERILE (MISCELLANEOUS) IMPLANT
SCREW SELF DRILL HT 1.5/4MM (Screw) ×30 IMPLANT
SPONGE NEURO XRAY DETECT 1X3 (DISPOSABLE) IMPLANT
SPONGE SURGIFOAM ABS GEL 100 (HEMOSTASIS) ×6 IMPLANT
STAPLER VISISTAT 35W (STAPLE) ×3 IMPLANT
STOCKINETTE 6  STRL (DRAPES) ×2
STOCKINETTE 6 STRL (DRAPES) ×1 IMPLANT
SUT NURALON 4 0 TR CR/8 (SUTURE) ×6 IMPLANT
SUT VIC AB 2-0 CT2 18 VCP726D (SUTURE) ×6 IMPLANT
SYR CONTROL 10ML LL (SYRINGE) IMPLANT
TOWEL OR 17X24 6PK STRL BLUE (TOWEL DISPOSABLE) ×3 IMPLANT
TOWEL OR 17X26 10 PK STRL BLUE (TOWEL DISPOSABLE) ×3 IMPLANT
TRAY FOLEY W/METER SILVER 16FR (SET/KITS/TRAYS/PACK) IMPLANT
UNDERPAD 30X30 INCONTINENT (UNDERPADS AND DIAPERS) IMPLANT
WATER STERILE IRR 1000ML POUR (IV SOLUTION) ×3 IMPLANT

## 2015-09-03 NOTE — Progress Notes (Signed)
atient overall stable. Remains on ventilator.Minimally arousable to pain. Purposeful with her right upper extremity. Moving both lower extremities. Abnormal flion with left upper extremity. Pupils remain equal at 3 mm and reactive. Cranial nerve function otherwise appears intact. MRI scan performed late last night demonstrates no evidence of progression of hemorrhage. 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 Plan to move forward with right-sided craniotomy and resection of tumor and evacuation of hematoma. I will attempt to contact family to obtain consent.
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
## 2015-09-03 NOTE — Progress Notes (Signed)
PT Cancellation Note  Patient Details Name: Jo Johnston MRN: [id number] DOB: 11-09-1965   Cancelled Treatment:    Reason Eval/Treat Not Completed: Patient not medically ready. Active bedrest orders.   Duncan Dull 09/03/2015, 7:50 AM Alben Deeds, PT DPT  [phone]

## 2015-09-03 NOTE — Anesthesia Procedure Notes (Signed)
Date/Time: 09/03/2015 12:57 PM Performed by: Eligha Bridegroom Pre-anesthesia Checklist: Patient identified, Emergency Drugs available, Suction available, Patient being monitored and Timeout performed Patient Re-evaluated:Patient Re-evaluated prior to inductionOxygen Delivery Method: Circle system utilized Intubation Type: IV induction

## 2015-09-03 NOTE — Transfer of Care (Signed)
Immediate Anesthesia Transfer of Care Note  Patient: Jo Johnston  Procedure(s) Performed: Procedure(s): Right craniotomy for excision of tumor (Right)  Patient Location: NICU  Anesthesia Type:General  Level of Consciousness: sedated, unresponsive and Patient remains intubated per anesthesia plan  Airway & Oxygen Therapy: Patient remains intubated per anesthesia plan and Patient placed on Ventilator (see vital sign flow sheet for setting)  Post-op Assessment: Report given to RN and Post -op Vital signs reviewed and stable  Post vital signs: Reviewed and stable  Last Vitals:  Filed Vitals:   09/03/15 1157 09/03/15 1440  BP: 142/88 113/82  Pulse: 54 80  Temp:  36.7 C  Resp: 22 22    Last Pain: There were no vitals filed for this visit.       Complications: No apparent anesthesia complications

## 2015-09-03 NOTE — Brief Op Note (Signed)
09/02/2015 - 09/03/2015  2:19 PM  PATIENT:  Jo Johnston  50 y.o. 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  PROCEDURE:  Procedure(s): Right craniotomy for excision of tumor (Right)  SURGEON:  Surgeon(s) and Role:    * Earnie Larsson, MD - Primary    * Kevan Ny Ditty, MD - Assisting  PHYSICIAN ASSISTANT:   ASSISTANTS:    ANESTHESIA:   general  EBL:  Total I/O In: 18.9 [I.V.:18.9] Out: 300 [Urine:300]  BLOOD ADMINISTERED:none  DRAINS: none   LOCAL MEDICATIONS USED:  LIDOCAINE   SPECIMEN:  Source of Specimen:  right frontal lobe  DISPOSITION OF SPECIMEN:  PATHOLOGY  COUNTS:  YES  TOURNIQUET:  * No tourniquets in log *  DICTATION: .Dragon Dictation  PLAN OF CARE: Admit to inpatient   PATIENT DISPOSITION:  ICU - intubated and hemodynamically stable.   Delay start of Pharmacological VTE agent (>24hrs) due to surgical blood loss or risk of bleeding: yes

## 2015-09-03 NOTE — Progress Notes (Signed)
PULMONARY / CRITICAL CARE MEDICINE   Name: Jo Johnston MRN: [id number] DOB: [birth year]    ADMISSION DATE:  09/02/2015 CONSULTATION DATE:  7/13  REFERRING MD:  Dr Hollice Gong Neurology  CHIEF COMPLAINT:  HA and AMS  HISTORY OF PRESENT ILLNESS:   50 year old female with PMH as below, which includes HTN, DM, Asthma, and bipolar disorder. She has apparently been complaining of headache for some time now, which significantly worsened 7/12 PM before going to bed. 7/13 in the morning she was not waking up so her husband Jo Johnston let her sleep a little longer. When he went back to check on her a few hours later he noted that she had voided her bladder into the bed. For that reason he called EMS and she was transported to [facility] ED. She was intubated for airway protection in ED. CT scan with ICH and possible malignancy. She was admitted by neurology who is prescribing mannitol and has consulted neurosurgery. PCCM to see for vent management.    SUBJECTIVE:  To OR for crani   VITAL SIGNS: BP 122/80 mmHg  Pulse 81  Temp(Src) 99.5 F (37.5 C) (Axillary)  Resp 22  Ht 5\' 1"  (1.549 m)  Wt 112 lb 14 oz (51.2 kg)  BMI 21.34 kg/m2  SpO2 100%  HEMODYNAMICS:    VENTILATOR SETTINGS: Vent Mode:  [-] PRVC FiO2 (%):  [40 %-100 %] 40 % Set Rate:  [16 bmp-22 bmp] 16 bmp Vt Set:  [390 mL] 390 mL PEEP:  [5 cmH20] 5 cmH20 Plateau Pressure:  [11 cmH20-13 cmH20] 12 cmH20  INTAKE / OUTPUT: I/O last 3 completed shifts: In: 481.3 [I.V.:181.3; IV Piggyback:300] Out: [id number] [Urine:1600; Drains:50]  PHYSICAL EXAMINATION: General:  Female of normal body habitus in NAD Neuro:  Obtunded on vent. Withdrawals from pain. Gaze fixed rt preference HEENT:  [place]/AT, no JVD Cardiovascular:  RRR, no MRG Lungs:  Clear bilateral breath sounds Abdomen:  Soft, non-distended Musculoskeletal:  No acute deformity Skin:  Grossly intact  LABS:  BMET  Recent Labs Lab 09/02/15 1532 09/02/15 1546 09/02/15 1854  09/03/15 0213 09/03/15 0650  NA 140 143 137 142 139  K 4.2 4.4  --   --   --   CL 106 105  --   --   --   CO2 24  --   --   --   --   BUN 15 23*  --   --   --   CREATININE 0.85 0.70  --   --   --   GLUCOSE 142* 141*  --   --   --     Electrolytes  Recent Labs Lab 09/02/15 1532  CALCIUM 9.9    CBC  Recent Labs Lab 09/02/15 1532 09/02/15 1546  WBC 20.1*  --   HGB 13.8 15.3*  HCT 41.6 45.0  PLT 249  --     Coag's  Recent Labs Lab 09/02/15 1532  APTT 23*  INR 1.04    Sepsis Markers No results for input(s): LATICACIDVEN, PROCALCITON, O2SATVEN in the last 168 hours.  ABG  Recent Labs Lab 09/02/15 1644 09/02/15 1841  PHART 7.254* 7.452*  PCO2ART 40.2 35.5  PO2ART 50.0* 232.0*    Liver Enzymes  Recent Labs Lab 09/02/15 1532  AST 37  ALT 20  ALKPHOS 72  BILITOT 0.8  ALBUMIN 4.1    Cardiac Enzymes No results for input(s): TROPONINI, PROBNP in the last 168 hours.  Glucose  Recent Labs Lab 09/02/15 1503 09/02/15 1928  09/02/15 2311 09/03/15 0306 09/03/15 0923  GLUCAP 146* 120* 134* 133* 133*    Imaging Mr Brain W Wo Contrast  09/03/2015  CLINICAL DATA:  Initial evaluation for acute intracranial hemorrhage. EXAM: MRI HEAD WITHOUT AND WITH CONTRAST TECHNIQUE: Multiplanar, multiecho pulse sequences of the brain and surrounding structures were obtained without and with intravenous contrast. CONTRAST:  38mL MULTIHANCE GADOBENATE DIMEGLUMINE 529 MG/ML IV SOLN COMPARISON:  Prior CT from 09/02/2015. FINDINGS: Cerebral volume normal for patient age. No significant underlying cerebral white matter disease present. Again seen is a large parenchymal hematoma centered at the right frontal lobe. While exact measurements are somewhat difficult, hematoma measures approximately 7.3 x 4.5 x 4.2 cm (AP by transverse by craniocaudad) in greatest transaxial dimensions. Estimated volume 69 cc. Intraventricular extension with blood within the lateral, third, and fourth  ventricles again seen. Overall degree of intraventricular blood is similar relative to prior CT. Serpiginous FLAIR signal intensity with layering within several cortical sulci of both cerebral hemispheres suspicious for a small amount of associated subarachnoid blood (series 6, image 17). Localized vasogenic edema about the dominant right frontal frontal lobe hematoma with mass effect on the adjacent right lateral ventricle. There is associated right to left shift of up to 16 mm. Mild right uncal herniation. No inferior or transtentorial herniation. No hydrocephalus. Asymmetric dilatation of the temporal horn of the left lateral ventricle without overt evidence for ventricular trapping. No transependymal flow of CSF. Underlying the parenchymal hematoma, there is a lobulated heterogeneously enhancing mass. Main portion of the mass is lobulated measuring 5.7 x 3.8 x 3.9 cm. This portion of the mass demonstrates heterogeneous internal and peripheral enhancement. An additional more solidly enhancing portion of the lesion positioned at the anterior right frontal convexity adjacent to the falx measures 3.1 x 1.9 x 2.2 cm (AP by transverse by craniocaudad). This lesion abuts the dura anteriorly and falx medially. No definite invasion of the adjacent superior sagittal sinus. This component is contiguous with the larger portion of the mass at its lateral and anterior aspect along the dura. Overall, the lesion demonstrates well-circumscribed margins. Finding concern underlying neoplasm, either primary or metastatic. No other abnormal enhancement or mass lesion identified. Note made of a small DVA within the right periatrial white matter. No acute intracranial infarct. Major intracranial vascular flow voids are maintained. No extra-axial fluid collection. Craniocervical junction within normal limits. Scattered multilevel degenerative spondylolysis noted within the upper cervical spine, most prevalent at C3-4. No significant  stenosis. Pituitary gland within normal limits. No acute abnormality about the globes and orbits. Scattered mucosal thickening throughout the paranasal sinuses. No mastoid effusion. Inner ear structures normal. Bone marrow signal intensity within normal limits. No scalp soft tissue abnormality. IMPRESSION: 1. Right frontal lobe parenchymal hematoma measuring approximately 7.3 x 4.5 x 4.2 cm with associated intraventricular extension. Associated mass effect and vasogenic edema within the right frontal lobe with up to 16 mm of right-to-left shift. No hydrocephalus. 2. Underlying heterogeneously enhancing right frontal lobe mass as above, concerning for neoplasm. This may be either primary or reflect solitary intracranial metastatic disease. No other lesions identified. 3. Probable small amount of associated subarachnoid hemorrhage within the bilateral frontal lobes. Electronically Signed   By: Jeannine Boga M.D.   On: 09/03/2015 02:25   Portable Chest Xray  09/03/2015  CLINICAL DATA:  Intubated. EXAM: PORTABLE CHEST 1 VIEW COMPARISON:  09/02/2015 FINDINGS: Endotracheal tube has been advanced, now 11 mm above the carina. NG tube enters the stomach. Lungs are clear.  Heart is normal size. No effusions or acute bony abnormality. IMPRESSION: Advancement of endotracheal tube, 11 mm above the carina. This could be retracted 2 cm for optimal position. No acute cardiopulmonary disease. Electronically Signed   By: Rolm Baptise M.D.   On: 09/03/2015 08:10   Dg Chest Portable 1 View  09/02/2015  CLINICAL DATA:  50 year old female with intracranial hemorrhage. Intubation. EXAM: PORTABLE CHEST 1 VIEW COMPARISON:  05/09/2015 FINDINGS: An endotracheal tube is noted with tip 3.8 cm above the carina. An NG tube is present with tip overlying proximal stomach. The side hole overlies the distal esophagus. There is no evidence of focal airspace disease, pulmonary edema, suspicious pulmonary nodule/mass, pleural effusion, or  pneumothorax. No acute bony abnormalities are identified. IMPRESSION: Support apparatus described. No evidence of acute cardiopulmonary disease. Electronically Signed   By: Margarette Canada M.D.   On: 09/02/2015 16:38   Ct Head Code Stroke W/o Cm  09/02/2015  CLINICAL DATA:  Altered mental status and headaches, unresponsive EXAM: CT HEAD WITHOUT CONTRAST TECHNIQUE: Contiguous axial images were obtained from the base of the skull through the vertex without intravenous contrast. COMPARISON:  03/31/2015 FINDINGS: Bony calvarium is intact. No gross soft tissue abnormality is seen. In the right frontal lobe there is a large intraparenchymal hemorrhage with some surrounding white matter edema. The hemorrhage measures approximately 6.1 x 4.3 cm in greatest AP and transverse dimensions. Additionally it measures approximately 4.3 cm in craniocaudad projection. Adjacent to this area of parenchymal hemorrhage and somewhat encased by the hemorrhage is a a rounded soft tissue mass lesion which measures 4.1 x 3.2 cm and it also demonstrates some significant white matter edema and is highly suspicious for underlying metastatic disease. Significant midline shift is noted from right to left of approximately 12-13 mm. A small amount of intraventricular component of the hemorrhage is noted in the lateral, third and fourth ventricles. IMPRESSION: Right frontal intraparenchymal hematoma as described. Some intraventricular extension is seen as well as significant midline shift from right to left. Rounded soft tissue lesion with some surrounding white matter edema which is somewhat encased by the parenchymal hematoma and is highly suggestive of metastatic disease. This lesion is new from the prior exam. Critical Value/emergent results were called by telephone at the time of interpretation on 09/02/2015 at 3:26 pm to Dr. Vanita Panda, who verbally acknowledged these results. Electronically Signed   By: Inez Catalina M.D.   On: 09/02/2015 15:25     STUDIES:  CT head 7/13 > Right frontal intraparenchymal hematoma as described. Some intraventricular extension is seen as well as significant midline shift from right to left. Rounded soft tissue lesion with some surrounding white matter edema which is somewhat encased by the parenchymal hematoma and is highly suggestive of metastatic disease. This lesion is new from the prior exam.  CULTURES:   ANTIBIOTICS:   SIGNIFICANT EVENTS: 7/13 admit 7/14 to OR for crani>>  LINES/TUBES: ETT 7/13 >  DISCUSSION: 50 year old female with PMH HTN, DM with headache for several months. Found unresponsive by husband and noted to have large [place] with concern for malignancy. Admitted to neurology and ICU. Plan to support with mechanical ventilation and tight BP control. Neurosurgery taking to OR 7/14.   ASSESSMENT / PLAN:  PULMONARY A: Inability to protect airway secondary to ICH H/o Asthma  P:   Full vent support, for OR 7/14 ABG CXR in AM Vent bundle PRN albuterol nebs  CARDIOVASCULAR A:  H/o HTN  P:  Telemetry BP  parameters per neurology PRN hydralazine Check troponin  RENAL A:   No acute issues  P:   Follow BMP  GASTROINTESTINAL A:   No acute issues  P:   NPO for now Pepcid for SUP  HEMATOLOGIC / Oncologic A:   New dx brain mass, ? Metastatic disease   P:  Follow CBC SCDs May need a global eval for malignancy, CT chest / abd / pelvis, depending on the path obtained from brain mass.   INFECTIOUS A:   Leukocytosis  P:   Monitor off ABX Trend WBC and fever curve  ENDOCRINE CBG (last 3)   Recent Labs  09/02/15 2311 09/03/15 0306 09/03/15 0923  GLUCAP 134* 133* 133*     A:   DM  P:   CBG monitoring and SSI q 4hours   NEUROLOGIC A:   Large R frontal intraparenchymal hemorrhage with intraventricular extension Soft tissue mass R frontal which appears to be focus of bleed   P:   RASS goal: 0 Propofol for ICU  sedation/comfort/agitation Neurology primary Neurosurgery to take to OR 7/14.   FAMILY  - Updates: Husband updated by neuro 7/14. Plan for OR per NS  - Inter-disciplinary family meet or Palliative Care meeting due by:  7/20   Richardson Landry Minor ACNP Maryanna Shape PCCM Pager [phone] till 3 pm If no answer page [phone] 09/03/2015, 10:36 AM   Attending Note:  I have examined patient, reviewed labs, studies and notes. I have discussed the case with S Minor, and I agree with the data and plans as amended above. 50 yo woman with HTN, admitted with AMS due to a large ICH associated with an apparent mass (new dx). On my evaluation she is sedated and appears comfortable on MV. Coarse BS. We will plan to support her with MV in preparation for craniotomy, brain mass resection and clot evacuation. Goal decerase sedation and assess for SBT's on 6/15 after stable post-op. Independent critical care time is 34 minutes.   Baltazar Apo, MD, PhD 09/03/2015, 11:30 AM [facility] [phone] or if no answer [phone]

## 2015-09-03 NOTE — Addendum Note (Signed)
Addendum  created 09/03/15 1912 by Eligha Bridegroom, CRNA   Modules edited: Anesthesia Events, Narrator   Narrator:  Narrator: Event Log Edited

## 2015-09-03 NOTE — Progress Notes (Signed)
OT Cancellation Note  Patient Details Name: Janki Matulich MRN: [id number] DOB: 02-23-1965   Cancelled Treatment:    Reason Eval/Treat Not Completed: Patient not medically ready (active bedrest orders)  Binnie Kand M.S., OTR/L Pager: [phone]  09/03/2015, 7:28 AM

## 2015-09-03 NOTE — Op Note (Signed)
Date of procedure: 09/03/2015  Date of dictation: Same  Service: Neurosurgery  Preoperative diagnosis: Right frontal brain tumor with associated intracerebral hemorrhage  Postoperative diagnosis: Same  Procedure Name: Bilateral frontal parietal craniotomy with resection of intraparenchymal tumor with associated intracerebral hemorrhage. Microdissection.  Surgeon:Timea Breed A.Garik Diamant, M.D.  Asst. Surgeon: Ditty  Anesthesia: General  Indication: 50 year old female presented with acute onset of unconsciousness. Workup demonstrates evidence of a large right frontal hemorrhage with an associated mass. MRI scan shows a bilobed mass which is both intraparenchymal but also with some dural attachment along the medial falx. Patient is critically ill. She presents now for craniotomy and resection of tumor as well as and evacuation of intracerebral hematoma.  Operative note: After induction of anesthesia, patient position supine with her head fixed in [place] pin headrest. Scalp was prepped and draped. A bicoronal incision bias toward the right was performed. Retractor was placed. Coronal suture and out of 5. Bur holes made in the parietal bone around the sagittal suture. Craniotome used to fashion a bifrontal parietal bone flap. Bone flap elevated. Sinus intact. Dura elevated in size. Dura was then flapped medially. Underlying frontal lobe was very swollen and tense. Medially there was a obvious interface between tumor and normal appearing brain. There is also associated hemorrhage. Dissection was then made along the plane between the normal brain and tumor. The tumor itself was very friable. The tumor was then resected in piecemeal fashion. There was certainly a dural attachment along the medial falx. This did not appear to invade the superior sagittal sinus however. A large amount of tumor was resected. There was also a significant amount hemorrhage surrounding the tumor which was also resected. Surrounding  brain appeared viable. No evidence of residual tumor left in the tumor bed. The wound was then irrigated with and bike solution. Surgifoam was placed for hemostasis and then evacuated. Resection bed was then lined with Surgicel. Dura was reapproximated using 4-0 Nurolon. Gelfoam placed over the craniotomy defect. Bone flap reapproximated using plates and screws. Wound closed using Vicryl sutures the galea and staples at the surface. No apparent complications. Patient tolerated the procedure well and she returns to the ICU in critical condition. Preliminary pathology could only say that there was a lesional material with high mitotic activity.

## 2015-09-03 NOTE — Care Management Note (Signed)
Case Management Note  Patient Details  Name: Vickey Solesbee MRN: [id number] Date of Birth: Feb 11, 1966  Subjective/Objective:    Pt admitted on 09/02/15 with severe HA/ICH/large dural mass.  PTA, pt independent, lives with boyfriend/significant other.                 Action/Plan: Pt s/p emergent crani for mass removal today, 7/14.  Will follow post op for discharge planning needs.    Expected Discharge Date:                  Expected Discharge Plan:  [place]  In-House Referral:     Discharge planning Services  CM Consult  Post Acute Care Choice:    Choice offered to:     DME Arranged:    DME Agency:     HH Arranged:    [place] Agency:     Status of Service:  In process, will continue to follow  If discussed at Long Length of Stay Meetings, dates discussed:    Additional Comments:  Reinaldo Raddle, RN, BSN  Trauma/Neuro ICU Case Manager [phone]

## 2015-09-03 NOTE — Anesthesia Postprocedure Evaluation (Signed)
Anesthesia Post Note  Patient: Jo Johnston  Procedure(s) Performed: Procedure(s) (LRB): Right craniotomy for excision of tumor (Right)  Patient location during evaluation: NICU Anesthesia Type: General Level of consciousness: sedated and patient remains intubated per anesthesia plan Pain management: pain level controlled Vital Signs Assessment: post-procedure vital signs reviewed and stable Respiratory status: patient on ventilator - see flowsheet for VS and patient remains intubated per anesthesia plan Cardiovascular status: stable Anesthetic complications: no    Last Vitals:  Filed Vitals:   09/03/15 1157 09/03/15 1440  BP: 142/88 113/82  Pulse: 54 80  Temp:  36.7 C  Resp: 22 22    Last Pain: There were no vitals filed for this visit.               Gregg Winchell EDWARD

## 2015-09-03 NOTE — Progress Notes (Addendum)
STROKE TEAM PROGRESS NOTE   HISTORY OF PRESENT ILLNESS (per record) Jo Johnston is an 50 y.o. female brought to ED after noted to be unresponsive. History is very unclear. Sister lives in [place] and is on her way. She keeps asking if she "overdosed". Possibly husband found her? EMS brought her to ED. On arrival she is non verbal and has eye deviation to the right. She was last known well yesterday 09/01/2015 at 22:00. CT showed a R frontal hemorrhage with IVH with midline shift, rounded lesion with white matter edema encased by ICH, ?mets. Intracerebral Hemorrhage (ICH) Score 3. Patient was not administered IV t-PA secondary to [place]. She was admitted to the neuro ICU for further evaluation and treatment.   SUBJECTIVE (INTERVAL HISTORY) Her family is at the bedside.  She is intubated. Dr. Leonie Man assessed and spoke with family related to MRI findings and plan for surgery.   OBJECTIVE Temp:  [90.7 F (32.6 C)-100.2 F (37.9 C)] 100 F (37.8 C) (07/14 0800) Pulse Rate:  [51-96] 72 (07/14 0808) Cardiac Rhythm:  [-] Normal sinus rhythm (07/13 2000) Resp:  [13-24] 22 (07/14 0808) BP: (111-210)/(70-115) 118/77 mmHg (07/14 0808) SpO2:  [98 %-100 %] 98 % (07/14 0808) FiO2 (%):  [40 %-100 %] 40 % (07/14 0808) Weight:  [51.2 kg (112 lb 14 oz)-52.6 kg (115 lb 15.4 oz)] 51.2 kg (112 lb 14 oz) (07/13 1845)  CBC:  Recent Labs Lab 09/02/15 1532 09/02/15 1546  WBC 20.1*  --   NEUTROABS 16.7*  --   HGB 13.8 15.3*  HCT 41.6 45.0  MCV 81.6  --   PLT 249  --     Basic Metabolic Panel:  Recent Labs Lab 09/02/15 1532 09/02/15 1546  09/03/15 0213 09/03/15 0650  NA 140 143  < > 142 139  K 4.2 4.4  --   --   --   CL 106 105  --   --   --   CO2 24  --   --   --   --   GLUCOSE 142* 141*  --   --   --   BUN 15 23*  --   --   --   CREATININE 0.85 0.70  --   --   --   CALCIUM 9.9  --   --   --   --   < > = values in this interval not displayed.  Lipid Panel:    Component Value Date/Time    TRIG 198* 09/02/2015 1854   HgbA1c: No results found for: HGBA1C Urine Drug Screen:    Component Value Date/Time   LABOPIA NONE DETECTED 09/02/2015 1745   COCAINSCRNUR NONE DETECTED 09/02/2015 1745   LABBENZ NONE DETECTED 09/02/2015 1745   AMPHETMU NONE DETECTED 09/02/2015 1745   THCU NONE DETECTED 09/02/2015 1745   LABBARB NONE DETECTED 09/02/2015 1745      IMAGING  Mr Brain W Wo Contrast  09/03/2015  CLINICAL DATA:  Initial evaluation for acute intracranial hemorrhage. EXAM: MRI HEAD WITHOUT AND WITH CONTRAST TECHNIQUE: Multiplanar, multiecho pulse sequences of the brain and surrounding structures were obtained without and with intravenous contrast. CONTRAST:  28mL MULTIHANCE GADOBENATE DIMEGLUMINE 529 MG/ML IV SOLN COMPARISON:  Prior CT from 09/02/2015. FINDINGS: Cerebral volume normal for patient age. No significant underlying cerebral white matter disease present. Again seen is a large parenchymal hematoma centered at the right frontal lobe. While exact measurements are somewhat difficult, hematoma measures approximately 7.3 x 4.5 x 4.2 cm (AP  by transverse by craniocaudad) in greatest transaxial dimensions. Estimated volume 69 cc. Intraventricular extension with blood within the lateral, third, and fourth ventricles again seen. Overall degree of intraventricular blood is similar relative to prior CT. Serpiginous FLAIR signal intensity with layering within several cortical sulci of both cerebral hemispheres suspicious for a small amount of associated subarachnoid blood (series 6, image 17). Localized vasogenic edema about the dominant right frontal frontal lobe hematoma with mass effect on the adjacent right lateral ventricle. There is associated right to left shift of up to 16 mm. Mild right uncal herniation. No inferior or transtentorial herniation. No hydrocephalus. Asymmetric dilatation of the temporal horn of the left lateral ventricle without overt evidence for ventricular trapping. No  transependymal flow of CSF. Underlying the parenchymal hematoma, there is a lobulated heterogeneously enhancing mass. Main portion of the mass is lobulated measuring 5.7 x 3.8 x 3.9 cm. This portion of the mass demonstrates heterogeneous internal and peripheral enhancement. An additional more solidly enhancing portion of the lesion positioned at the anterior right frontal convexity adjacent to the falx measures 3.1 x 1.9 x 2.2 cm (AP by transverse by craniocaudad). This lesion abuts the dura anteriorly and falx medially. No definite invasion of the adjacent superior sagittal sinus. This component is contiguous with the larger portion of the mass at its lateral and anterior aspect along the dura. Overall, the lesion demonstrates well-circumscribed margins. Finding concern underlying neoplasm, either primary or metastatic. No other abnormal enhancement or mass lesion identified. Note made of a small DVA within the right periatrial white matter. No acute intracranial infarct. Major intracranial vascular flow voids are maintained. No extra-axial fluid collection. Craniocervical junction within normal limits. Scattered multilevel degenerative spondylolysis noted within the upper cervical spine, most prevalent at C3-4. No significant stenosis. Pituitary gland within normal limits. No acute abnormality about the globes and orbits. Scattered mucosal thickening throughout the paranasal sinuses. No mastoid effusion. Inner ear structures normal. Bone marrow signal intensity within normal limits. No scalp soft tissue abnormality. IMPRESSION: 1. Right frontal lobe parenchymal hematoma measuring approximately 7.3 x 4.5 x 4.2 cm with associated intraventricular extension. Associated mass effect and vasogenic edema within the right frontal lobe with up to 16 mm of right-to-left shift. No hydrocephalus. 2. Underlying heterogeneously enhancing right frontal lobe mass as above, concerning for neoplasm. This may be either primary or  reflect solitary intracranial metastatic disease. No other lesions identified. 3. Probable small amount of associated subarachnoid hemorrhage within the bilateral frontal lobes. Electronically Signed   By: Jeannine Boga M.D.   On: 09/03/2015 02:25   Portable Chest Xray  09/03/2015  CLINICAL DATA:  Intubated. EXAM: PORTABLE CHEST 1 VIEW COMPARISON:  09/02/2015 FINDINGS: Endotracheal tube has been advanced, now 11 mm above the carina. NG tube enters the stomach. Lungs are clear. Heart is normal size. No effusions or acute bony abnormality. IMPRESSION: Advancement of endotracheal tube, 11 mm above the carina. This could be retracted 2 cm for optimal position. No acute cardiopulmonary disease. Electronically Signed   By: Rolm Baptise M.D.   On: 09/03/2015 08:10   Dg Chest Portable 1 View  09/02/2015  CLINICAL DATA:  50 year old female with intracranial hemorrhage. Intubation. EXAM: PORTABLE CHEST 1 VIEW COMPARISON:  05/09/2015 FINDINGS: An endotracheal tube is noted with tip 3.8 cm above the carina. An NG tube is present with tip overlying proximal stomach. The side hole overlies the distal esophagus. There is no evidence of focal airspace disease, pulmonary edema, suspicious pulmonary nodule/mass, pleural effusion,  or pneumothorax. No acute bony abnormalities are identified. IMPRESSION: Support apparatus described. No evidence of acute cardiopulmonary disease. Electronically Signed   By: Margarette Canada M.D.   On: 09/02/2015 16:38   Ct Head Code Stroke W/o Cm  09/02/2015  CLINICAL DATA:  Altered mental status and headaches, unresponsive EXAM: CT HEAD WITHOUT CONTRAST TECHNIQUE: Contiguous axial images were obtained from the base of the skull through the vertex without intravenous contrast. COMPARISON:  03/31/2015 FINDINGS: Bony calvarium is intact. No gross soft tissue abnormality is seen. In the right frontal lobe there is a large intraparenchymal hemorrhage with some surrounding white matter edema. The  hemorrhage measures approximately 6.1 x 4.3 cm in greatest AP and transverse dimensions. Additionally it measures approximately 4.3 cm in craniocaudad projection. Adjacent to this area of parenchymal hemorrhage and somewhat encased by the hemorrhage is a a rounded soft tissue mass lesion which measures 4.1 x 3.2 cm and it also demonstrates some significant white matter edema and is highly suspicious for underlying metastatic disease. Significant midline shift is noted from right to left of approximately 12-13 mm. A small amount of intraventricular component of the hemorrhage is noted in the lateral, third and fourth ventricles. IMPRESSION: Right frontal intraparenchymal hematoma as described. Some intraventricular extension is seen as well as significant midline shift from right to left. Rounded soft tissue lesion with some surrounding white matter edema which is somewhat encased by the parenchymal hematoma and is highly suggestive of metastatic disease. This lesion is new from the prior exam. Critical Value/emergent results were called by telephone at the time of interpretation on 09/02/2015 at 3:26 pm to Dr. Vanita Panda, who verbally acknowledged these results. Electronically Signed   By: Inez Catalina M.D.   On: 09/02/2015 15:25       PHYSICAL EXAM Middle aged caucasian lady intubated and sedated. . Afebrile. Head is nontraumatic. Neck is supple without bruit.    Cardiac exam no murmur or gallop. Lungs are clear to auscultation. Distal pulses are well felt. Neurological Exam :  Sedated. Intubated. Eyes are closed. Right gaze deviation. Pupils 3 mm equal reactive. Fundi could not be visualized. Does not blink to threat on either side. Face is symmetric. Tongue midline. Dense left hemiplegia with minimum withdrawal to pain. Purposeful right sided movements spontaneously. Mild extensor posturing of the left upper extremity to pain. Slight cervical in the left lower extremity to pain. Left plantar upgoing right  downgoing. ASSESSMENT/PLAN Jo Johnston is a 50 y.o. female with history of HTN, DB, asthma, bipolar and cigarette smoking found unresponsive. CT showed a R frontal ICH with IVH, ? underlying mass.  R frontal ICH with IVH, SAH, mass effect with midline shift (cerebral edema) concerning for metastatic disease  CT head R frontal hemorrhage with IVH with midline shift, rounded lesion with white matter edema encased by ICH, ?mets  MRI  R frontal ICH w/ IVH and mass effectwith 94mm R to L shift. Underlying enhancing R frontal lobe mass concerning for neoplasm. Small SAH frontal lobes  SCDs for VTE prophylaxis  Diet NPO time specified  No antithrombotic prior to admission  Dr. Leonie Man called and discussed cased with radiologist and with neurosurgeon Trenton Gammon). Surgery planned for today  Therapy recommendations:  pending   Disposition:  pending   Discontinue stroke related orders  Consider transfer to [facility] post OR  Acute respiratory failure  Secondary to [place], ? Mass  Intubated  Sedated  CCM following  Cerebral edema  Received 2 doses of mannital  thus far, ordered q6h  Added dexamethasone 8 mg IV x 1 followed by 4mg  q6h  Can consider 3% if needed  Hypertensive Emergency  Stable now  As high as 210/115 last evening   Diabetes  Glucoses 141, 142  Other Stroke Risk Factors  Cigarette smoker  Other Active Problems  Bipolar 1 disorder  Hospital day # [place] for Pager information 09/03/2015 9:25 AM   I have personally examined this patient, reviewed notes, independently viewed imaging studies, participated in medical decision making and plan of care. I have made any additions or clarifications directly to the above note. Agree with note above. 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 I have discussed the case with Dr. Annette Stable who plans for emergent craniotomy today. Continue mannitol for reducing intracranial pressure and add steroids. Family not available for discussion at the present time This patient is critically ill and at significant risk of neurological worsening, death and care requires constant monitoring of vital signs, hemodynamics,respiratory and cardiac monitoring, extensive review of multiple databases, frequent neurological assessment, discussion with family, other specialists and medical decision making of high complexity.I have made any additions or clarifications directly to the above note.This critical care time does not reflect procedure time, or teaching time or supervisory time of PA/NP/Med Resident etc but could involve care discussion time.  I spent 40 minutes of neurocritical care time  in the care of  this patient. I spoke to Dr. Annette Stable who agreed to transfer the patient to neurosurgery service. Stroke team will follow on consults      Antony Contras, MD Medical Director [place] Pager: [phone] 09/03/2015 4:35 PM   To contact Stroke Continuity provider, please refer to [email or web address] After hours, contact General Neurology

## 2015-09-03 NOTE — Progress Notes (Signed)
   09/03/15 1114  Clinical Encounter Type  Visited With Patient and family together;Health care provider  Visit Type Initial;Spiritual support;Pre-op;Critical Care  Referral From Nurse;Chaplain  Spiritual Encounters  Spiritual Needs Prayer;Emotional  Stress Factors  Family Stress Factors Health changes   Chaplain met with patient, patient's boyfriend, and patient's sister. Patient is going to surgery soon for her brain mass. Chaplain offered prayer and support. Chaplain introduced spiritual care services.  Chaplain will seek to follow-up. Spiritual care services available as needed.   Jeri Lager, Chaplain 09/03/2015 11:15 AM

## 2015-09-03 NOTE — Anesthesia Preprocedure Evaluation (Addendum)
Anesthesia Evaluation  Patient identified by MRN, date of birth, ID band Patient unresponsive    Reviewed: NPO status , Patient's Chart, lab work & pertinent test results, Unable to perform ROS - Chart review only  Airway Mallampati: I       Dental   Pulmonary Current Smoker,    Pulmonary exam normal        Cardiovascular hypertension, Normal cardiovascular exam     Neuro/Psych Bipolar Disorder    GI/Hepatic   Endo/Other  diabetes, Well Controlled  Renal/GU      Musculoskeletal   Abdominal   Peds  Hematology   Anesthesia Other Findings Brain tumor w/ bleed  Reproductive/Obstetrics                            Anesthesia Physical Anesthesia Plan  ASA: III  Anesthesia Plan: General   Post-op Pain Management:    Induction: Intravenous  Airway Management Planned: Oral ETT  Additional Equipment: Arterial line  Intra-op Plan:   Post-operative Plan: Post-operative intubation/ventilation  Informed Consent: I have reviewed the patients History and Physical, chart, labs and discussed the procedure including the risks, benefits and alternatives for the proposed anesthesia with the patient or authorized representative who has indicated his/her understanding and acceptance.     Plan Discussed with: CRNA, Anesthesiologist and Surgeon  Anesthesia Plan Comments:         Anesthesia Quick Evaluation

## 2015-09-04 ENCOUNTER — Inpatient Hospital Stay (HOSPITAL_COMMUNITY): Payer: Medicaid Other

## 2015-09-04 DIAGNOSIS — J988 Other specified respiratory disorders: Secondary | ICD-10-CM

## 2015-09-04 DIAGNOSIS — I612 Nontraumatic intracerebral hemorrhage in hemisphere, unspecified: Secondary | ICD-10-CM

## 2015-09-04 LAB — BASIC METABOLIC PANEL
Anion gap: 9 (ref 5–15)
BUN: 17 mg/dL (ref 6–20)
CHLORIDE: 114 mmol/L — AB (ref 101–111)
CO2: 22 mmol/L (ref 22–32)
CREATININE: 0.71 mg/dL (ref 0.44–1.00)
Calcium: 8.8 mg/dL — ABNORMAL LOW (ref 8.9–10.3)
GFR calc Af Amer: >60 mL/min (ref >60)
GFR calc non Af Amer: >60 mL/min (ref >60)
Glucose, Bld: 138 mg/dL — ABNORMAL HIGH (ref 65–99)
Potassium: 3.9 mmol/L (ref 3.5–5.1)
SODIUM: 145 mmol/L (ref 135–145)

## 2015-09-04 LAB — GLUCOSE, CAPILLARY
Glucose-Capillary: 145 mg/dL — ABNORMAL HIGH (ref 65–99)
Glucose-Capillary: 135 mg/dL — ABNORMAL HIGH (ref 65–99)
Glucose-Capillary: 147 mg/dL — ABNORMAL HIGH (ref 65–99)
Glucose-Capillary: 134 mg/dL — ABNORMAL HIGH (ref 65–99)
Glucose-Capillary: 121 mg/dL — ABNORMAL HIGH (ref 65–99)

## 2015-09-04 LAB — CBC
HCT: 35.3 % — ABNORMAL LOW (ref 36.0–46.0)
HEMOGLOBIN: 10.8 g/dL — AB (ref 12.0–15.0)
MCH: 26.2 pg (ref 26.0–34.0)
MCHC: 30.6 g/dL (ref 30.0–36.0)
MCV: 85.5 fL (ref 78.0–100.0)
Platelets: 213 10*3/uL (ref 150–400)
RBC: 4.13 MIL/uL (ref 3.87–5.11)
RDW: 13.9 % (ref 11.5–15.5)
WBC: 17.2 10*3/uL — ABNORMAL HIGH (ref 4.0–10.5)

## 2015-09-04 MED ORDER — FENTANYL CITRATE (PF) 100 MCG/2ML IJ SOLN
12.5000 ug | INTRAMUSCULAR | Status: DC | PRN
  Administered 2015-09-04 – 2015-09-05 (×6): 25 ug via INTRAVENOUS
  Administered 2015-09-05: 12.5 ug via INTRAVENOUS
  Administered 2015-09-05 – 2015-09-07 (×8): 25 ug via INTRAVENOUS
  Filled 2015-09-04 (×16): qty 2

## 2015-09-04 MED ORDER — CHLORHEXIDINE GLUCONATE 0.12 % MT SOLN
OROMUCOSAL | Status: AC
  Administered 2015-09-04: 15 mL via OROMUCOSAL
  Filled 2015-09-04: qty 15

## 2015-09-04 NOTE — Progress Notes (Signed)
eLink Physician-Brief Progress Note Patient Name: Jo Johnston DOB: 1966/01/27 MRN: [id number]   Date of Service  09/04/2015  HPI/Events of Note  Patient s/p resection of frontal tumor and hematoma.  Today noted to have episodes of sinus bradycardia into 30's w/o change in BP or neuro status.  CT of head today showed improvement.    eICU Interventions  No change in treatment.  Continue to observe closely     Intervention Category Intermediate Interventions: Arrhythmia - evaluation and management  Mauri Brooklyn, P 09/04/2015, 8:00 PM

## 2015-09-04 NOTE — Progress Notes (Signed)
Patient's HR has dipped down into the 30s multiple times, but remains in a sinus rhythm. HR returns to NSR and BP remains stable during each episode. Dr. Halford Chessman of [facility] notified of the changes in HR. No new orders received at this time. Will continue to monitor patient and update as needed.

## 2015-09-04 NOTE — Progress Notes (Signed)
PULMONARY / CRITICAL CARE MEDICINE   Name: Jo Johnston MRN: [id number] DOB: 1965/12/15    ADMISSION DATE:  09/02/2015 CONSULTATION DATE:  7/13  REFERRING MD:  Dr Hollice Gong Neurology  CHIEF COMPLAINT:  HA and AMS  HISTORY OF PRESENT ILLNESS:   50 year old female with hx HTN, DM, Asthma, and bipolar disorder. Presented 7/13 with headache and AMS. She was intubated for airway protection in ED. CT scan with ICH and possible malignancy. She was admitted by neurology, given mannitol and ultimately taken to OR for crani with resection of intraparenchymal tumor.     SUBJECTIVE:  Following some commands per nursing. Just got bolus of propofol for agitation while family was in room.  Now more sedate.   VITAL SIGNS: BP 142/61 mmHg  Pulse 56  Temp(Src) 99.7 F (37.6 C) (Core (Comment))  Resp 20  Ht 5\' 1"  (1.549 m)  Wt 51.2 kg (112 lb 14 oz)  BMI 21.34 kg/m2  SpO2 98%  HEMODYNAMICS:    VENTILATOR SETTINGS: Vent Mode:  [-] PSV;CPAP FiO2 (%):  [40 %] 40 % Set Rate:  [16 bmp-22 bmp] 22 bmp Vt Set:  [390 mL] 390 mL PEEP:  [5 cmH20] 5 cmH20 Pressure Support:  [10 cmH20] 10 cmH20 Plateau Pressure:  [10 cmH20-13 cmH20] 13 cmH20  INTAKE / OUTPUT: I/O last 3 completed shifts: In: 2814.2 [I.V.:2054.2; IV Piggyback:760] Out: 2925 [Urine:2825; Blood:100]  PHYSICAL EXAMINATION: General:  Female of normal body habitus in NAD Neuro:  Sedated on vent, RASS -2 after bolus, following commands previously per RN, L sided weakness  HEENT:  [place]/AT, no JVD Cardiovascular:  RRR, no MRG Lungs:  resps even non labored on PS 10/5, Clear bilateral breath sounds Abdomen:  Soft, non-distended Musculoskeletal:  No acute deformity Skin:  Grossly intact  LABS:  BMET  Recent Labs Lab 09/02/15 1532 09/02/15 1546  09/03/15 1219 09/03/15 1600 09/04/15 0655  NA 140 143  < > 145 141 145  K 4.2 4.4  --   --  3.7 3.9  CL 106 105  --   --  111 114*  CO2 24  --   --   --  23 22  BUN 15 23*  --    --  18 17  CREATININE 0.85 0.70  --   --  0.86 0.71  GLUCOSE 142* 141*  --   --  131* 138*  < > = values in this interval not displayed.  Electrolytes  Recent Labs Lab 09/02/15 1532 09/03/15 1600 09/04/15 0655  CALCIUM 9.9 8.4* 8.8*    CBC  Recent Labs Lab 09/02/15 1532 09/02/15 1546 09/03/15 1600 09/04/15 0655  WBC 20.1*  --  12.5* 17.2*  HGB 13.8 15.3* 11.2* 10.8*  HCT 41.6 45.0 34.9* 35.3*  PLT 249  --  202 213    Coag's  Recent Labs Lab 09/02/15 1532  APTT 23*  INR 1.04    Sepsis Markers No results for input(s): LATICACIDVEN, PROCALCITON, O2SATVEN in the last 168 hours.  ABG  Recent Labs Lab 09/02/15 1644 09/02/15 1841  PHART 7.254* 7.452*  PCO2ART 40.2 35.5  PO2ART 50.0* 232.0*    Liver Enzymes  Recent Labs Lab 09/02/15 1532  AST 37  ALT 20  ALKPHOS 72  BILITOT 0.8  ALBUMIN 4.1    Cardiac Enzymes No results for input(s): TROPONINI, PROBNP in the last 168 hours.  Glucose  Recent Labs Lab 09/03/15 1144 09/03/15 1621 09/03/15 1934 09/03/15 2307 09/04/15 0313 09/04/15 0719  GLUCAP 128*  132* 138* 149* 145* 135*    Imaging No results found.  STUDIES:  CT head 7/13 > Right frontal intraparenchymal hematoma as described. Some intraventricular extension is seen as well as significant midline shift from right to left. Rounded soft tissue lesion with some surrounding white matter edema which is somewhat encased by the parenchymal hematoma and is highly suggestive of metastatic disease. This lesion is new from the prior exam.  CULTURES:   ANTIBIOTICS:   SIGNIFICANT EVENTS: 7/13 admit 7/14 to OR for crani>>  LINES/TUBES: ETT 7/13 > L rad aline 7/13>>>  DISCUSSION: 50 year old female with PMH HTN, DM with headache for several months. Found unresponsive by husband and noted to have large [place] with concern for malignancy. Admitted to neurology and ICU. Plan to support with mechanical ventilation and tight BP control.  Neurosurgery taking to OR 7/14.   ASSESSMENT / PLAN:  PULMONARY A: Inability to protect airway secondary to ICH H/o Asthma P:   PS wean as tol  Consider extubation if mental status improves  ABG CXR in AM Vent bundle PRN albuterol nebs  CARDIOVASCULAR A:  H/o HTN P:  Telemetry BP parameters per neurology PRN hydralazine Check troponin  RENAL A:   No acute issues P:   Follow BMP  GASTROINTESTINAL A:   No acute issues P:   NPO for now Pepcid for SUP  HEMATOLOGIC / Oncologic A:   New dx brain mass, ? Metastatic disease  P:  Follow CBC SCDs May need a global eval for malignancy, CT chest / abd / pelvis, depending on the path obtained from brain mass.   INFECTIOUS A:   Leukocytosis  P:   Monitor off ABX Trend WBC and fever curve  ENDOCRINE A:   DM P:   CBG monitoring and SSI q 4hours   NEUROLOGIC A:   Large R frontal intraparenchymal hemorrhage with intraventricular extension Soft tissue mass R frontal which appears to be focus of bleed  P:   RASS goal: 0 Propofol for ICU sedation/comfort/agitation Neurology primary Daily WUA  Await path from tumor  Decadron     FAMILY  - Updates: family updated at bedside 7/15.  - Inter-disciplinary family meet or Palliative Care meeting due by:  7/20     Nickolas Madrid, NP 09/04/2015  10:08 AM Pager: [phone] or [phone]  Attending note: Follows commands with assistance of spanish interpretor.    HR regular.  No wheeze.  Abd soft.  No edema.  Moves Rt side.  WBC 17.2, Creatinine 0.71.  CXR no ASD  Assessment/plan: Compromised airway after ICH. - extubate 7/15  ICH from brain mass >> concern for metastatic disease. - f/u surgical path  CC time by me independent of APP time 31 minutes.  Chesley Mires, MD [facility] Pulmonary/Critical Care 09/04/2015, 1:07 PM Pager:  [phone] After 3pm call: [phone]

## 2015-09-04 NOTE — Progress Notes (Signed)
Patient remained sedated on ventilator. She shows minimal signs of I opening. She will occasionally follow simple commands with her right upper extremity. She strongly purposeful with her right side. Still with some abnormal flexion on the left. Moving both lower extremities. Pupils are 3 mm and reactive. She has somewhat of a downward gaze preference this morning however. Wound is clean and dry.  She is afebrile. Her vitals are stable. Urine output is good hematocrit acceptable. Electrolytes normal  Postop day 1 from a large right frontal tumor and intracerebral hematoma resection. Check follow-up head CT scan to evaluate for progression of hydrocephalus.

## 2015-09-04 NOTE — Progress Notes (Signed)
Pt. Has been having episodes of sinus bradycardia today.  Pt. HR went down to 31 at shift change. Pt. Stayed in 30's for several minutes before coming back up to 40's for several minutes and then went back to 60's/70's.  CCM notified.  Plan is to get an ECG if it happens again.  Will continue to monitor closely.

## 2015-09-04 NOTE — Progress Notes (Signed)
STROKE TEAM PROGRESS NOTE   HISTORY OF PRESENT ILLNESS (per record) Jo Johnston is an 50 y.o. female brought to ED after noted to be unresponsive. History is very unclear. Sister lives in [place] and is on her way. She keeps asking if she "overdosed". Possibly husband found her? EMS brought her to ED. On arrival she is non verbal and has eye deviation to the right. She was last known well yesterday 09/01/2015 at 22:00. CT showed a R frontal hemorrhage with IVH with midline shift, rounded lesion with white matter edema encased by ICH, ?mets. Intracerebral Hemorrhage (ICH) Score 3. Patient was not administered IV t-PA secondary to [place]. She was admitted to the neuro ICU for further evaluation and treatment.    SUBJECTIVE (INTERVAL HISTORY) Her family is at the bedside.  She is intubated. Dr. Trenton Gammon also assessed and spoke with family related post-surgery plans/care.  OBJECTIVE Temp:  [98.1 F (36.7 C)-100.2 F (37.9 C)] 99.9 F (37.7 C) (07/15 0700) Pulse Rate:  [50-105] 61 (07/15 0700) Cardiac Rhythm:  [-] Normal sinus rhythm (07/14 2000) Resp:  [18-26] 22 (07/15 0700) BP: (110-145)/(61-94) 145/64 mmHg (07/15 0700) SpO2:  [96 %-100 %] 98 % (07/15 0806) Arterial Line BP: (105-152)/(71-104) 123/72 mmHg (07/15 0700) FiO2 (%):  [40 %] 40 % (07/15 0806)  CBC:  Recent Labs Lab 09/02/15 1532  09/03/15 1600 09/04/15 0655  WBC 20.1*  --  12.5* 17.2*  NEUTROABS 16.7*  --   --   --   HGB 13.8  < > 11.2* 10.8*  HCT 41.6  < > 34.9* 35.3*  MCV 81.6  --  83.9 85.5  PLT 249  --  202 213  < > = values in this interval not displayed.  Basic Metabolic Panel:   Recent Labs Lab 09/03/15 1600 09/04/15 0655  NA 141 145  K 3.7 3.9  CL 111 114*  CO2 23 22  GLUCOSE 131* 138*  BUN 18 17  CREATININE 0.86 0.71  CALCIUM 8.4* 8.8*    Lipid Panel:     Component Value Date/Time   TRIG 198* 09/02/2015 1854   HgbA1c: No results found for: HGBA1C Urine Drug Screen:     Component Value  Date/Time   LABOPIA NONE DETECTED 09/02/2015 1745   COCAINSCRNUR NONE DETECTED 09/02/2015 1745   LABBENZ NONE DETECTED 09/02/2015 1745   AMPHETMU NONE DETECTED 09/02/2015 1745   THCU NONE DETECTED 09/02/2015 1745   LABBARB NONE DETECTED 09/02/2015 1745      IMAGING  Mr Brain W Wo Contrast 09/03/2015   1. Right frontal lobe parenchymal hematoma measuring approximately 7.3 x 4.5 x 4.2 cm with associated intraventricular extension. Associated mass effect and vasogenic edema within the right frontal lobe with up to 16 mm of right-to-left shift. No hydrocephalus.  2. Underlying heterogeneously enhancing right frontal lobe mass as above, concerning for neoplasm. This may be either primary or reflect solitary intracranial metastatic disease. No other lesions identified.  3. Probable small amount of associated subarachnoid hemorrhage within the bilateral frontal lobes.   Portable Chest Xray 09/03/2015   Advancement of endotracheal tube, 11 mm above the carina. This could be retracted 2 cm for optimal position. No acute cardiopulmonary disease.   Dg Chest Portable 1 View 09/02/2015   Support apparatus described. No evidence of acute cardiopulmonary disease.   Ct Head Code Stroke W/o Cm 09/02/2015   Right frontal intraparenchymal hematoma as described. Some intraventricular extension is seen as well as significant midline shift from right to left.  Rounded soft tissue lesion with some surrounding white matter edema which is somewhat encased by the parenchymal hematoma and is highly suggestive of metastatic disease. This lesion is new from the prior exam.   PHYSICAL EXAM Middle aged woman intubated and sedated. . Afebrile. Head is bandaged. Neck is supple without bruit.    Cardiac exam no murmur or gallop. Lungs are clear to auscultation. [place] is non-distended. Distal pulses are well felt.  Neurological Exam :  Sedated. Intubated. Eyes are closed. Downward gaze.  Pupils 3 mm equal reactive. Does  not blink to threat on either side. Face is grossly symmetric. Dense left hemiplegia with minimum withdrawal to pain. Purposeful right sided movements spontaneously. Mild extensor posturing of the left upper extremity to pain.   ASSESSMENT/PLAN [name] is a 50 y.o. female with history of HTN, DB, asthma, bipolar and cigarette smoking found unresponsive. CT showed a R frontal ICH with IVH, ? underlying mass.  R frontal ICH with IVH, SAH, mass effect with midline shift (cerebral edema) concerning for metastatic disease  CT head R frontal hemorrhage with IVH with midline shift, rounded lesion with white matter edema encased by ICH, ?mets  MRI  R frontal ICH w/ IVH and mass effectwith 36mm R to L shift. Underlying enhancing R frontal lobe mass concerning for neoplasm. Small SAH frontal lobes  SCDs for VTE prophylaxis Diet NPO time specified  No antithrombotic prior to admission  Dr. Leonie Man called and discussed cased with radiologist and with neurosurgeon Trenton Gammon). Surgery post-op day 1  today  Therapy recommendations:  pending   Disposition:  pending   Discontinue stroke related orders  Will transfer to NS   Acute respiratory failure  Secondary to [place], ? Mass  Intubated  Sedated  CCM following  Cerebral edema  Received 2 doses of mannital thus far, ordered q6h  Added dexamethasone 8 mg IV x 1 followed by 4mg  q6h  Can consider 3% if needed  Hypertensive Emergency  Stable now  As high as 210/115 last evening.- 112/68 on Saturday   Diabetes  Glucoses 141, 142  Other Stroke Risk Factors  Cigarette smoker  Other Active Problems  Bipolar 1 disorder  Anemia  Leukocytosis / low-grade fever   - (Dexamethasone for cerebral edema Friday)   Hospital day # [place] for Pager information 09/04/2015 8:08 AM   I have personally examined this patient, reviewed notes, independently viewed imaging studies,  participated in medical decision making and plan of care. I have made any additions or clarifications directly to the above note. Agree with note above. 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   This patient is critically ill and at significant risk of neurological worsening, death and care requires constant monitoring of vital signs, hemodynamics,respiratory and cardiac monitoring, extensive review of multiple databases, frequent neurological assessment, discussion with family, other specialists and medical decision making of high complexity.I have made any additions or clarifications directly to the above note.This critical care time does not reflect procedure time, or teaching time or supervisory time of PA/NP/Med Resident etc but could involve care discussion time.   I spent 40 minutes of neurocritical care time  in the care of  this patient.  I spoke to Dr. Annette Stable who agreed to transfer the patient to neurosurgery service.  To contact Stroke Continuity provider, please refer to [email or web address] After hours, contact General Neurology

## 2015-09-04 NOTE — Procedures (Signed)
Extubation Procedure Note  Patient Details:   Name: Analaya Milko DOB: 08-Jun-1965 MRN: [id number]   Airway Documentation:     Evaluation  O2 sats: stable throughout Complications: No apparent complications Patient did tolerate procedure well. Bilateral Breath Sounds: Clear   Pt extubated at this time per MD order, placed on 4L [place] tolerating well, no stridor noted at this time, RT will monitor.   Ciro Backer 09/04/2015, 11:57 AM

## 2015-09-04 NOTE — Progress Notes (Signed)
No acute events Opens eyes intermittently Following commands on right, weak on left Continue supportive care

## 2015-09-05 ENCOUNTER — Inpatient Hospital Stay (HOSPITAL_COMMUNITY): Payer: Medicaid Other

## 2015-09-05 LAB — BASIC METABOLIC PANEL
Anion gap: 8 (ref 5–15)
BUN: 16 mg/dL (ref 6–20)
CO2: 21 mmol/L — ABNORMAL LOW (ref 22–32)
CREATININE: 0.64 mg/dL (ref 0.44–1.00)
Calcium: 9 mg/dL (ref 8.9–10.3)
Chloride: 114 mmol/L — ABNORMAL HIGH (ref 101–111)
Glucose, Bld: 126 mg/dL — ABNORMAL HIGH (ref 65–99)
POTASSIUM: 3.3 mmol/L — AB (ref 3.5–5.1)
SODIUM: 143 mmol/L (ref 135–145)

## 2015-09-05 LAB — GLUCOSE, CAPILLARY
GLUCOSE-CAPILLARY: 119 mg/dL — AB (ref 65–99)
GLUCOSE-CAPILLARY: 124 mg/dL — AB (ref 65–99)
GLUCOSE-CAPILLARY: 153 mg/dL — AB (ref 65–99)
Glucose-Capillary: 136 mg/dL — ABNORMAL HIGH (ref 65–99)
Glucose-Capillary: 117 mg/dL — ABNORMAL HIGH (ref 65–99)
Glucose-Capillary: 123 mg/dL — ABNORMAL HIGH (ref 65–99)
Glucose-Capillary: 137 mg/dL — ABNORMAL HIGH (ref 65–99)

## 2015-09-05 LAB — MAGNESIUM
Magnesium: 1.9 mg/dL (ref 1.7–2.4)
Magnesium: 2 mg/dL (ref 1.7–2.4)

## 2015-09-05 LAB — CBC
HCT: 36 % (ref 36.0–46.0)
Hemoglobin: 11.2 g/dL — ABNORMAL LOW (ref 12.0–15.0)
MCH: 26.2 pg (ref 26.0–34.0)
MCHC: 31.1 g/dL (ref 30.0–36.0)
MCV: 84.3 fL (ref 78.0–100.0)
PLATELETS: 237 10*3/uL (ref 150–400)
RBC: 4.27 MIL/uL (ref 3.87–5.11)
RDW: 13.6 % (ref 11.5–15.5)
WBC: 16.6 10*3/uL — ABNORMAL HIGH (ref 4.0–10.5)

## 2015-09-05 LAB — PHOSPHORUS
PHOSPHORUS: 3.3 mg/dL (ref 2.5–4.6)
PHOSPHORUS: 3.7 mg/dL (ref 2.5–4.6)

## 2015-09-05 MED ORDER — POTASSIUM CHLORIDE 10 MEQ/100ML IV SOLN
10.0000 meq | INTRAVENOUS | Status: AC
  Administered 2015-09-05 (×4): 10 meq via INTRAVENOUS
  Filled 2015-09-05 (×4): qty 100

## 2015-09-05 MED ORDER — PRO-STAT SUGAR FREE PO LIQD
30.0000 mL | Freq: Two times a day (BID) | ORAL | Status: DC
  Administered 2015-09-05 – 2015-09-06 (×3): 30 mL
  Filled 2015-09-05 (×3): qty 30

## 2015-09-05 MED ORDER — HYDROCODONE-ACETAMINOPHEN 5-325 MG PO TABS
1.0000 | ORAL_TABLET | ORAL | Status: DC | PRN
  Administered 2015-09-05 – 2015-09-06 (×6): 1 via ORAL
  Filled 2015-09-05 (×6): qty 1

## 2015-09-05 MED ORDER — POTASSIUM CHLORIDE CRYS ER 20 MEQ PO TBCR: 40.0000 meq | EXTENDED_RELEASE_TABLET | Freq: Once | ORAL | Status: DC

## 2015-09-05 MED ORDER — VITAL HIGH PROTEIN PO LIQD
1000.0000 mL | ORAL | Status: DC
  Administered 2015-09-05: 1000 mL
  Administered 2015-09-06: 07:00:00
  Filled 2015-09-05: qty 1000

## 2015-09-05 MED ORDER — CHLORHEXIDINE GLUCONATE 0.12 % MT SOLN
OROMUCOSAL | Status: AC
  Administered 2015-09-05: 15 mL via OROMUCOSAL
  Filled 2015-09-05: qty 15

## 2015-09-05 NOTE — Progress Notes (Signed)
No acute events Not opening eyes Cries Follows on the right Dense hemiparesis left Incision looks good Keep in ICU at least one more day NGT and oral pain meds

## 2015-09-05 NOTE — Progress Notes (Signed)
PULMONARY / CRITICAL CARE MEDICINE   Name: Jo Johnston MRN: [id number] DOB: 12-Jul-1965    ADMISSION DATE:  09/02/2015 CONSULTATION DATE:  7/13  REFERRING MD:  Dr Hollice Gong Neurology  CHIEF COMPLAINT:  HA and AMS  HISTORY OF PRESENT ILLNESS:   50 year old female with hx HTN, DM, Asthma, and bipolar disorder. Presented 7/13 with headache and AMS. She was intubated for airway protection in ED. CT scan with ICH and possible malignancy. She was admitted by neurology, given mannitol and ultimately taken to OR for crani with resection of intraparenchymal tumor.     SUBJECTIVE:  Stable post extubation 7/16.  Moans, follows commands, points to her head.     VITAL SIGNS: BP 124/61 mmHg  Pulse 57  Temp(Src) 99.7 F (37.6 C) (Core (Comment))  Resp 18  Ht 5\' 1"  (1.549 m)  Wt 51.2 kg (112 lb 14 oz)  BMI 21.34 kg/m2  SpO2 99%  HEMODYNAMICS:    VENTILATOR SETTINGS: Vent Mode:  [-] PSV;CPAP FiO2 (%):  [40 %] 40 % PEEP:  [5 cmH20] 5 cmH20 Pressure Support:  [5 cmH20] 5 cmH20  INTAKE / OUTPUT: I/O last 3 completed shifts: In: 3920.6 [I.V.:2955.6; IV Piggyback:965] Out: [id number]  PHYSICAL EXAMINATION: General:  Female of normal body habitus in NAD Neuro:  Drowsy but easily arousable, will not open eyes, follows commands, L sided weakness, points to head, moans.   HEENT:  [place]/AT, no JVD, large R crani incision with staples Cardiovascular:  RRR, no MRG Lungs:  resps even non labored on [place], Clear bilateral breath sounds Abdomen:  Soft, non-distended Musculoskeletal:  No acute deformity Skin:  Grossly intact  LABS:  BMET  Recent Labs Lab 09/03/15 1600 09/04/15 0655 09/05/15 0228  NA 141 145 143  K 3.7 3.9 3.3*  CL 111 114* 114*  CO2 23 22 21*  BUN 18 17 16   CREATININE 0.86 0.71 0.64  GLUCOSE 131* 138* 126*    Electrolytes  Recent Labs Lab 09/03/15 1600 09/04/15 0655 09/05/15 0228  CALCIUM 8.4* 8.8* 9.0    CBC  Recent Labs Lab 09/03/15 1600  09/04/15 0655 09/05/15 0228  WBC 12.5* 17.2* 16.6*  HGB 11.2* 10.8* 11.2*  HCT 34.9* 35.3* 36.0  PLT 202 213 237    Coag's  Recent Labs Lab 09/02/15 1532  APTT 23*  INR 1.04    Sepsis Markers No results for input(s): LATICACIDVEN, PROCALCITON, O2SATVEN in the last 168 hours.  ABG  Recent Labs Lab 09/02/15 1644 09/02/15 1841  PHART 7.254* 7.452*  PCO2ART 40.2 35.5  PO2ART 50.0* 232.0*    Liver Enzymes  Recent Labs Lab 09/02/15 1532  AST 37  ALT 20  ALKPHOS 72  BILITOT 0.8  ALBUMIN 4.1    Cardiac Enzymes No results for input(s): TROPONINI, PROBNP in the last 168 hours.  Glucose  Recent Labs Lab 09/04/15 1119 09/04/15 1611 09/04/15 1936 09/04/15 2338 09/05/15 0305 09/05/15 0749  GLUCAP 147* 134* 121* 119* 136* 117*    Imaging Ct Head Wo Contrast  09/04/2015  CLINICAL DATA:  Intracranial hemorrhage. EXAM: CT HEAD WITHOUT CONTRAST TECHNIQUE: Contiguous axial images were obtained from the base of the skull through the vertex without intravenous contrast. COMPARISON:  MRI brain 09/03/2015. CT head without contrast 09/02/2015. FINDINGS: A right frontal craniotomy was performed for evacuation of the right frontal hemorrhage. Majority of the right frontal hemorrhage has been resected. Pneumocephalus is present. There is gas within the lateral ventricles bilaterally. Layering intraventricular hemorrhage is present bilaterally. Blood  products extending to the fourth ventricle. There is some residual parenchymal hemorrhage. Midline shift just above the foramen of [place] is improved, but still measures 8.5 mm. This previously measured 14 mm. A remains asymmetric mass effect on the right lateral ventricle with residual edema around the resection cavity of the hemorrhage. A small amount of extra-axial fluid is present over the anterior right frontal convexity. No new hemorrhage is present. The brainstem and cerebellum are otherwise within normal limits. Skullbase is  within normal limits. Chronic mucosal thickening is present in the left maxillary sinus. IMPRESSION: 1. Interval right frontal craniotomy with evacuation of the right frontal parenchymal hemorrhage and significant decrease in mass effect. 2. Marked decrease in midline shift, still measuring 8.5 mm. 3. No new hemorrhage or infarct. 4. Interventricular hemorrhage as detailed above. 5. Pneumocephalus following craniotomy. A small right extra-axial fluid collection is present. Electronically Signed   By: San Morelle M.D.   On: 09/04/2015 14:20    STUDIES:  CT head 7/13 > Right frontal intraparenchymal hematoma as described. Some intraventricular extension is seen as well as significant midline shift from right to left. Rounded soft tissue lesion with some surrounding white matter edema which is somewhat encased by the parenchymal hematoma and is highly suggestive of metastatic disease. This lesion is new from the prior exam.  CULTURES:   ANTIBIOTICS:   SIGNIFICANT EVENTS: 7/13 admit 7/14 to OR for crani>>  LINES/TUBES: ETT 7/13 > L rad aline 7/13>>>  DISCUSSION: 51 year old female with PMH HTN, DM with headache for several months. Found unresponsive by husband and noted to have large [place] with concern for malignancy. Admitted to neurology and ICU. Plan to support with mechanical ventilation and tight BP control. Neurosurgery taking to OR 7/14.   ASSESSMENT / PLAN:  PULMONARY A: Inability to protect airway secondary to ICH H/o Asthma P:   Pulmonary hygiene  Mobilize when ok with nsgy  PRN albuterol nebs  CARDIOVASCULAR A:  H/o HTN Intermittent bradycardia - asymptomatic  P:  Telemetry BP parameters per neurology PRN hydralazine  RENAL A:   Hypokalemia  P:   Follow BMP Replete K   GASTROINTESTINAL A:   No acute issues P:   NPO for now Will attempt NG for TF  Pepcid for SUP  HEMATOLOGIC / Oncologic A:   New dx brain mass, ? Metastatic disease  P:  Follow  CBC SCDs Needs a global eval for malignancy, CT chest / abd / pelvis, depending on the path obtained from brain mass.   INFECTIOUS A:   Leukocytosis  P:   Monitor off ABX Trend WBC and fever curve  ENDOCRINE A:   DM P:   CBG monitoring and SSI q 4hours   NEUROLOGIC A:   Large R frontal intraparenchymal hemorrhage with intraventricular extension Soft tissue mass R frontal which appears to be focus of bleed  P:   RASS goal: 0 PRN fentantly for pain  Neurology primary Await path from tumor  Decadron     FAMILY  - Updates: no family available 7/16.   - Inter-disciplinary family meet or Palliative Care meeting due by:  7/20     Nickolas Madrid, NP 09/05/2015  9:43 AM Pager: [phone] or [phone]   Attending note: Follows commands when spoke to in [place].    Sleepy.  Moves Rt side.  HR regular.  No wheeze.  Abd soft.  Assessment/plan: Brain tumor s/p resection. - f/u pathology  Chesley Mires, MD [facility] Pulmonary/Critical Care 09/05/2015,  3:15 PM Pager:  [phone] After 3pm call: [phone]

## 2015-09-05 NOTE — Progress Notes (Signed)
eLink Physician-Brief Progress Note Patient Name: Jo Johnston DOB: June 11, 1965 MRN: [id number]   Date of Service  09/05/2015  HPI/Events of Note  Hypokalemia  eICU Interventions  Potassium replaced     Intervention Category Minor Interventions: Electrolytes abnormality - evaluation and management  DETERDING,ELIZABETH 09/05/2015, 4:00 AM

## 2015-09-06 ENCOUNTER — Encounter (HOSPITAL_COMMUNITY): Payer: Self-pay | Admitting: Neurosurgery

## 2015-09-06 DIAGNOSIS — I10 Essential (primary) hypertension: Secondary | ICD-10-CM

## 2015-09-06 DIAGNOSIS — I615 Nontraumatic intracerebral hemorrhage, intraventricular: Secondary | ICD-10-CM

## 2015-09-06 LAB — GLUCOSE, CAPILLARY
GLUCOSE-CAPILLARY: 130 mg/dL — AB (ref 65–99)
GLUCOSE-CAPILLARY: 149 mg/dL — AB (ref 65–99)
GLUCOSE-CAPILLARY: 118 mg/dL — AB (ref 65–99)
Glucose-Capillary: 120 mg/dL — ABNORMAL HIGH (ref 65–99)
Glucose-Capillary: 133 mg/dL — ABNORMAL HIGH (ref 65–99)
Glucose-Capillary: 147 mg/dL — ABNORMAL HIGH (ref 65–99)

## 2015-09-06 LAB — BASIC METABOLIC PANEL
ANION GAP: 5 (ref 5–15)
BUN: 31 mg/dL — ABNORMAL HIGH (ref 6–20)
CALCIUM: 8.5 mg/dL — AB (ref 8.9–10.3)
CO2: 21 mmol/L — AB (ref 22–32)
Chloride: 112 mmol/L — ABNORMAL HIGH (ref 101–111)
Creatinine, Ser: 0.64 mg/dL (ref 0.44–1.00)
GLUCOSE: 171 mg/dL — AB (ref 65–99)
POTASSIUM: 3.9 mmol/L (ref 3.5–5.1)
Sodium: 138 mmol/L (ref 135–145)

## 2015-09-06 LAB — MAGNESIUM
Magnesium: 1.9 mg/dL (ref 1.7–2.4)
Magnesium: 1.9 mg/dL (ref 1.7–2.4)

## 2015-09-06 LAB — PHOSPHORUS
PHOSPHORUS: 3.6 mg/dL (ref 2.5–4.6)
PHOSPHORUS: 2.9 mg/dL (ref 2.5–4.6)

## 2015-09-06 LAB — CBC
HEMATOCRIT: 32.2 % — AB (ref 36.0–46.0)
Hemoglobin: 10.1 g/dL — ABNORMAL LOW (ref 12.0–15.0)
MCH: 26.3 pg (ref 26.0–34.0)
MCHC: 31.4 g/dL (ref 30.0–36.0)
MCV: 83.9 fL (ref 78.0–100.0)
PLATELETS: 226 10*3/uL (ref 150–400)
RBC: 3.84 MIL/uL — AB (ref 3.87–5.11)
RDW: 13.4 % (ref 11.5–15.5)
WBC: 10.8 10*3/uL — AB (ref 4.0–10.5)

## 2015-09-06 MED ORDER — ENSURE ENLIVE PO LIQD
237.0000 mL | Freq: Two times a day (BID) | ORAL | Status: DC
  Administered 2015-09-07 – 2015-09-13 (×10): 237 mL via ORAL
  Filled 2015-09-06 (×12): qty 237

## 2015-09-06 MED ORDER — INSULIN ASPART 100 UNIT/ML ~~LOC~~ SOLN
0.0000 [IU] | SUBCUTANEOUS | Status: DC
  Administered 2015-09-06 – 2015-09-08 (×8): 1 [IU] via SUBCUTANEOUS
  Administered 2015-09-09 (×2): 2 [IU] via SUBCUTANEOUS
  Administered 2015-09-09 – 2015-09-10 (×4): 1 [IU] via SUBCUTANEOUS
  Administered 2015-09-10: 2 [IU] via SUBCUTANEOUS
  Administered 2015-09-10 – 2015-09-12 (×8): 1 [IU] via SUBCUTANEOUS
  Administered 2015-09-12: 2 [IU] via SUBCUTANEOUS
  Administered 2015-09-12: 1 [IU] via SUBCUTANEOUS
  Administered 2015-09-12 (×2): 2 [IU] via SUBCUTANEOUS
  Administered 2015-09-13 (×2): 1 [IU] via SUBCUTANEOUS
  Administered 2015-09-13: 3 [IU] via SUBCUTANEOUS

## 2015-09-06 MED ORDER — DEXAMETHASONE SODIUM PHOSPHATE 4 MG/ML IJ SOLN
2.0000 mg | Freq: Two times a day (BID) | INTRAMUSCULAR | Status: DC
Start: 2015-09-06 — End: 2015-09-13
  Administered 2015-09-06 – 2015-09-13 (×14): 2 mg via INTRAVENOUS
  Filled 2015-09-06 (×14): qty 1

## 2015-09-06 NOTE — Progress Notes (Signed)
Nutrition Follow-up  INTERVENTION:   Ensure Enlive po BID, each supplement provides 350 kcal and 20 grams of protein  NUTRITION DIAGNOSIS:   Inadequate oral intake related to dysphagia, lethargy/confusion as evidenced by meal completion < 50%. Ongoing.   GOAL:   Patient will meet greater than or equal to 90% of their needs Progressing.   MONITOR:   PO intake, Supplement acceptance, I & O's, Diet advancement  REASON FOR ASSESSMENT:   Consult Enteral/tube feeding initiation and management  ASSESSMENT:   Jo Johnston is a 50 y.o. female with history of HTN, DB, asthma, bipolar and cigarette smoking found unresponsive. CT showed a R frontal ICH with IVH, ? underlying mass.  7/15 extubated 7/16 NG tube placed for nutrition 7/17 passed swallow eval, NG tube removed Pt somewhat confused, awaiting path results from brain tumor resection  Medications reviewed and include: decadron, senokot-s, KCl  Labs reviewed  CBG's: 118-149 Pt discussed during ICU rounds and with RN.    Diet Order:  DIET - DYS 1 Room service appropriate?: Yes; Fluid consistency:: Nectar Thick  Skin:  Reviewed, no issues (head incision)  Last BM:  unknown  Height:   Ht Readings from Last 1 Encounters:  09/06/15 5\' 1"  (1.549 m)    Weight:   Wt Readings from Last 1 Encounters:  09/06/15 115 lb 8.3 oz (52.4 kg)    Ideal Body Weight:  47.7 kg  BMI:  Body mass index is 21.84 kg/(m^2).  Estimated Nutritional Needs:   Kcal:  1500-1700  Protein:  75-90 grams  Fluid:  > 1.5 L/day  EDUCATION NEEDS:   No education needs identified at this time  [place], [place], [place] Pager [phone] After Hours Pager

## 2015-09-06 NOTE — Progress Notes (Signed)
Overall stable. No new issues or problems. Patient awake and appears aware. Answers some simple questions although speech is moderately garbled. Still with a left upper extremity weakness. Moving both legs. Follows commands with right upper extremity.  Vitals are stable. Labs acceptable.  Overall progressing well. Patient okay to transfer to stepdown. Continue supportive efforts.

## 2015-09-06 NOTE — Progress Notes (Signed)
PULMONARY / CRITICAL CARE MEDICINE   Name: Jo Johnston MRN: [id number] DOB: 07-12-1965    ADMISSION DATE:  09/02/2015 CONSULTATION DATE:  7/13  REFERRING MD:  Dr Hollice Gong Neurology  CHIEF COMPLAINT:  HA and AMS  HISTORY OF PRESENT ILLNESS:   50 year old female with hx HTN, DM, Asthma, and bipolar disorder. Presented 7/13 with headache and AMS. She was intubated for airway protection in ED. CT scan with ICH and possible malignancy. She was admitted by neurology, given mannitol and ultimately taken to OR for crani with resection of intraparenchymal tumor.     SUBJECTIVE:  More awake.  Says she cannot open eyes (attempts to open them manually with her finger).  Follows commands.  Crying.    VITAL SIGNS: BP 161/108 mmHg  Pulse 69  Temp(Src) 97.5 F (36.4 C) (Axillary)  Resp 15  Ht 5\' 1"  (1.549 m)  Wt 52.4 kg (115 lb 8.3 oz)  BMI 21.84 kg/m2  SpO2 96%   INTAKE / OUTPUT: I/O last 3 completed shifts: In: [id number] [I.V.:2700; NG/GT:652; IV Piggyback:865] Out: 2030 [Urine:2030]  PHYSICAL EXAMINATION: General:  Female of normal body habitus in NAD Neuro:  Awake, alert, anxious, moaning, crying.  Follows commands with R side, L sided weakness   HEENT:  [place]/AT, no JVD, large R crani incision with staples Cardiovascular:  RRR, no MRG Lungs:  resps even non labored on [place], Clear bilateral breath sounds Abdomen:  Soft, non-distended Musculoskeletal:  No acute deformity Skin:  Grossly intact  LABS:  BMET  Recent Labs Lab 09/04/15 0655 09/05/15 0228 09/06/15 0335  NA 145 143 138  K 3.9 3.3* 3.9  CL 114* 114* 112*  CO2 22 21* 21*  BUN 17 16 31*  CREATININE 0.71 0.64 0.64  GLUCOSE 138* 126* 171*    Electrolytes  Recent Labs Lab 09/04/15 0655 09/05/15 0228 09/05/15 1017 09/05/15 1655 09/06/15 0335  CALCIUM 8.8* 9.0  --   --  8.5*  MG  --   --  1.9 2.0 1.9  PHOS  --   --  3.3 3.7 3.6    CBC  Recent Labs Lab 09/04/15 0655 09/05/15 0228 09/06/15 0335   WBC 17.2* 16.6* 10.8*  HGB 10.8* 11.2* 10.1*  HCT 35.3* 36.0 32.2*  PLT 213 237 226    Coag's  Recent Labs Lab 09/02/15 1532  APTT 23*  INR 1.04    Sepsis Markers No results for input(s): LATICACIDVEN, PROCALCITON, O2SATVEN in the last 168 hours.  ABG  Recent Labs Lab 09/02/15 1644 09/02/15 1841  PHART 7.254* 7.452*  PCO2ART 40.2 35.5  PO2ART 50.0* 232.0*    Liver Enzymes  Recent Labs Lab 09/02/15 1532  AST 37  ALT 20  ALKPHOS 72  BILITOT 0.8  ALBUMIN 4.1    Cardiac Enzymes No results for input(s): TROPONINI, PROBNP in the last 168 hours.  Glucose  Recent Labs Lab 09/05/15 1122 09/05/15 1551 09/05/15 1916 09/05/15 2314 09/06/15 0303 09/06/15 0739  GLUCAP 123* 153* 124* 137* 130* 149*    Imaging Dg Abd Portable 1v  09/05/2015  CLINICAL DATA:  NG tube placement EXAM: PORTABLE ABDOMEN - 1 VIEW COMPARISON:  None. FINDINGS: The tip of an NG tube overlies the gastric fundus. Bowel gas pattern nonspecific. Apparent temperature probe overlies the midline anatomic pelvis. IMPRESSION: NG tube tip looped in the gastric fundus. Electronically Signed   By: Misty Stanley M.D.   On: 09/05/2015 12:40    STUDIES:  CT Head W/O 7/13: Right frontal intraparenchymal  hematoma as described. Some intraventricular extension is seen as well as significant midline shift from right to left. Rounded soft tissue lesion with some surrounding white matter edema which is somewhat encased by the parenchymal hematoma and is highly suggestive of metastatic disease. This lesion is new from the prior exam. CT Head W/O 7/15: Interval right frontal craniotomy. Decreased size of mass effect. Decreased midline shift. No new hemorrhage or infarct.  MICROBIOLOGY: MRSA PCR 7/13:  Negative  ANTIBIOTICS: NONE  SIGNIFICANT EVENTS: 7/13 - admit 7/14 - OR for craniotomy 7/15 - extubated  LINES/TUBES: ETT 7/13 - 7/15 L rad aline 7/13 - 7/15 PIV x2  DISCUSSION: 50 year old female  with PMH HTN, DM with headache for several months. Found unresponsive by husband and noted to have large [place] with concern for malignancy. Admitted to neurology and ICU. Plan to support with mechanical ventilation and tight BP control. Neurosurgery taking to OR 7/14.   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  H/O Bipolar D/O Type 1  P:   RASS goal: 0 Fentanyl IV prn Neurology Primary Neurosurgery Following Await path from tumor  Decadron IV q12hr Keppra IV q12hr Mobilize if ok with nsgy  Holding home Abilify  PULMONARY A: H/O Asthma  P:   Pulmonary hygiene with Incentive Spirometry Mobilize when ok with nsgy  Albuterol neb prn  CARDIOVASCULAR A:  H/O HTN Intermittent Bradycardia  P:  Continuing Telemetry Monitoring Vitals per unit protocol BP parameters per Neurology/Neurosurgery Hydralazine IV prn  RENAL A:   Hypokalemia - Resolved.  P:   Trending electrolytes & renal function daily Replacing electrolytes as indicated  GASTROINTESTINAL A:   No acute issues.  P:   Swallow eval  Diet per speech recs   HEMATOLOGIC / ONCOLOGIC A:   Brain Mass - Pathology pending. Anemia - Stable. ICH w/ brain mass. Leukocytosis - Resolving.  P:  Trending cell counts daily w/ CBC SCDs Plan for further staging once path results  INFECTIOUS A:   No acute infection.  P:   Plan to re-culture for fever Monitor off antibiotics for now  ENDOCRINE A:   H/O DM Type 2 - BG controlled.  P:   Accu-Checks q4hr SSI per Sensitive Algorithm  FAMILY  - Updates: no family available 7/17 - updated at length by RN and nsgy.    - Inter-disciplinary family meet or Palliative Care meeting due by:  7/20    Nickolas Madrid, NP 09/06/2015  9:54 AM Pager: [phone] or [phone]   PCCM Attending Note: Patient seen and examined with nurse practitioner. Please refer to her progress  note which I reviewed in detail. Patient wanting to get out of bed. Very tearful and won't answer further questions.  Review of Systems: Patient refusing to answer further questions and very tearful saying she wants to go on.  BP 153/91 mmHg  Pulse 67  Temp(Src) 97.5 F (36.4 C) (Axillary)  Resp 17  Ht 5\' 1"  (1.549 m)  Wt 115 lb 8.3 oz (52.4 kg)  BMI 21.84 kg/m2  SpO2 97% General:  Awake. No acute distress. No family at bedside.  Integument:  Warm & dry. No rash on exposed skin. Craniotomy incision clean, dry, and intact. HEENT:  Moist mucus membranes. No oral ulcers. No scleral injection or icterus.  Cardiovascular:  Regular rate. No edema. No appreciable JVD.  Pulmonary:  Good aeration & clear to auscultation bilaterally.  Normal work of breathing. Abdomen: Soft. Normal bowel sounds. Nondistended.  Neurological: Patient oriented to place, year, and president. Following commands on her right side. Left side is flaccid. Some slurred speech.  CBC Latest Ref Rng 09/06/2015 09/05/2015 09/04/2015  WBC 4.0 - 10.5 K/uL 10.8(H) 16.6(H) 17.2(H)  Hemoglobin 12.0 - 15.0 g/dL 10.1(L) 11.2(L) 10.8(L)  Hematocrit 36.0 - 46.0 % 32.2(L) 36.0 35.3(L)  Platelets 150 - 400 K/uL 226 237 213    BMP Latest Ref Rng 09/06/2015 09/05/2015 09/04/2015  Glucose 65 - 99 mg/dL 171(H) 126(H) 138(H)  BUN 6 - 20 mg/dL 31(H) 16 17  Creatinine 0.44 - 1.00 mg/dL 0.64 0.64 0.71  Sodium 135 - 145 mmol/L 138 143 145  Potassium 3.5 - 5.1 mmol/L 3.9 3.3(L) 3.9  Chloride 101 - 111 mmol/L 112(H) 114(H) 114(H)  CO2 22 - 32 mmol/L 21(L) 21(L) 22  Calcium 8.9 - 10.3 mg/dL 8.5(L) 9.0 8.8(L)    A/P:  50 year old female status post craniotomy for right frontal brain mass with intracerebral hemorrhage.Pathology still pending. Patient planned to transition out of the ICU today. Blood pressure has remained relatively stable. Awaiting final pathology from her mass excision.  1. Right frontal intracerebral hemorrhage: Management per  neurology & neurosurgery. 2. Right frontal brain mass: Awaiting pathology. Patient needs further staging with PET/CT imaging & consultation by oncology once pathology is confirmed. 3. Diabetes mellitus type 2: Continuing sliding scale insulin per sensitive algorithm with Accu-Cheks every 4 hours. 4. Hypertension: Blood pressure has been relatively stable overall. Continuing hydralazine IV when necessary. 5. History of bipolar type I disorder: Continuing to hold Abilify. 6. Prophylaxis: SCDs. 7. Diet: Nothing by mouth pending speech evaluation. 8. Disposition: Patient stable for transfer to stepdown unit per neurosurgery. PT & OT consulted.  PCCM to sign off. Please re-consult if we can be of further assistance.  Sonia Baller Ashok Cordia, M.D. [facility] Pager:  [phone] After 3pm or if no response, call [phone] 11:56 AM 09/06/2015

## 2015-09-06 NOTE — Evaluation (Signed)
Physical Therapy Evaluation Patient Details Name: Remie Whitmer MRN: [id number] DOB: 05-Jan-1966 Today's Date: 09/06/2015   History of Present Illness  50 year old female with hx HTN, DM, Asthma, and bipolar disorder. Presented 7/13 with headache and AMS. She was intubated for airway protection in ED. CT scan with ICH and possible malignancy. She was admitted by neurology, given mannitol and ultimately taken to OR for crani with resection of intraparenchymal tumor  Clinical Impression  Patient demonstrates deficits in functional mobility as indicated below. Will need continued skilled PT to address deficits and maximize function. Will see as indicated and progress as tolerated. Patient with significant deficits left side. Increased physical 2 person assist required for all aspects of mobility, cognitive impairments present as well. Recommend CIR consult for post acute discharge.   OF NOTE: VSS during session    Follow Up Recommendations CIR;Supervision/Assistance - 24 hour    Equipment Recommendations  Wheelchair (measurements PT);Wheelchair cushion (measurements PT)    Recommendations for Other Services Rehab consult     Precautions / Restrictions Precautions Precautions: Fall Precaution Comments: AMS Restrictions Weight Bearing Restrictions: No      Mobility  Bed Mobility Overal bed mobility: Needs Assistance;+2 for physical assistance;+ 2 for safety/equipment Bed Mobility: Rolling;Supine to Sit Rolling: Max assist;+2 for physical assistance;+2 for safety/equipment   Supine to sit: Total assist;+2 for physical assistance;+2 for safety/equipment     General bed mobility comments: Patient able to use righ tleg and arm to assist with roll to the left, no ability to assist rolling to the right  Transfers Overall transfer level: Needs assistance   Transfers: Sit to/from Stand;Stand Pivot Transfers Sit to Stand: Max assist;+2 physical assistance;+2  safety/equipment Stand pivot transfers: Total assist;+2 physical assistance       General transfer comment: Total assist for pivot to chair using gait belt and chuck pad with 2 persons assist  Ambulation/Gait                Stairs            Wheelchair Mobility    Modified Rankin (Stroke Patients Only) Modified Rankin (Stroke Patients Only) Pre-Morbid Rankin Score: No symptoms Modified Rankin: Severe disability     Balance Overall balance assessment: Needs assistance Sitting-balance support: Feet supported Sitting balance-Leahy Scale: Zero Sitting balance - Comments: no evidence of trunk engagement or ability to maintain sitting balance Postural control: Left lateral lean                                   Pertinent Vitals/Pain Pain Assessment: Faces Faces Pain Scale: Hurts little more Pain Location: unable to determine    Home Living Family/patient expects to be discharged to:: Inpatient rehab                      Prior Function           Comments: unknown     Hand Dominance        Extremity/Trunk Assessment   Upper Extremity Assessment: Defer to OT evaluation           Lower Extremity Assessment: LLE deficits/detail   LLE Deficits / Details: not active movement LLE, increased tone noted (extensor)     Communication   Communication: Expressive difficulties  Cognition Arousal/Alertness: Awake/alert Behavior During Therapy: Restless;Anxious Overall Cognitive Status: No family/caregiver present to determine baseline cognitive functioning Area of Impairment: Attention;Memory;Following commands;Safety/judgement;Awareness;Problem  solving   Current Attention Level: Sustained   Following Commands: Follows one step commands inconsistently;Follows one step commands with increased time Safety/Judgement: Decreased awareness of safety;Decreased awareness of deficits Awareness: Intellectual Problem Solving: Slow  processing;Decreased initiation;Difficulty sequencing;Requires verbal cues;Requires tactile cues General Comments: pt knew she had urinated but did not know to ring for help, patient extermely labile and moaning/crying out with bouts of laughter during session    General Comments      Exercises        Assessment/Plan    PT Assessment Patient needs continued PT services  PT Diagnosis Difficulty walking;Abnormality of gait;Generalized weakness;Acute pain;Hemiplegia non-dominant side   PT Problem List Decreased strength;Decreased activity tolerance;Decreased balance;Decreased mobility;Decreased coordination;Decreased cognition;Decreased safety awareness;Impaired tone  PT Treatment Interventions DME instruction;Gait training;Functional mobility training;Therapeutic activities;Therapeutic exercise;Balance training;Neuromuscular re-education;Cognitive remediation;Patient/family education;Wheelchair mobility training;Manual techniques   PT Goals (Current goals can be found in the Care Plan section) Acute Rehab PT Goals Patient Stated Goal: to go home PT Goal Formulation: With patient Time For Goal Achievement: 09/20/15 Potential to Achieve Goals: Fair    Frequency Min 3X/week   Barriers to discharge        Co-evaluation               End of Session Equipment Utilized During Treatment: Gait belt Activity Tolerance: Patient tolerated treatment well Patient left: in chair;with call bell/phone within reach;with chair alarm set Nurse Communication: Mobility status         Time: [id number] PT Time Calculation (min) (ACUTE ONLY): 24 min   Charges:   PT Evaluation $PT Eval Moderate Complexity: 1 Procedure PT Treatments $Therapeutic Activity: 8-22 mins   PT G CodesDuncan Dull Sep 14, 2015, 2:00 PM Alben Deeds, [place] DPT  [phone]

## 2015-09-06 NOTE — Progress Notes (Signed)
Rehab Admissions Coordinator Note:  Patient was screened by Retta Diones for appropriateness for an Inpatient Acute Rehab Consult.  At this time, we are recommending Inpatient Rehab consult.  Retta Diones 09/06/2015, 2:54 PM  I can be reached at [phone].

## 2015-09-06 NOTE — Evaluation (Signed)
Clinical/Bedside Swallow Evaluation Patient Details  Name: Jo Johnston MRN: [id number] Date of Birth: January 18, 1966  Today's Date: 09/06/2015 Time: SLP Start Time (ACUTE ONLY): [id number] SLP Stop Time (ACUTE ONLY): 1355 SLP Time Calculation (min) (ACUTE ONLY): 20 min  Past Medical History:  Past Medical History  Diagnosis Date  . Hypertension   . Diabetes mellitus without complication ([place])   . Asthma   . Bipolar 1 disorder [facility])    Past Surgical History:  Past Surgical History  Procedure Laterality Date  . Breast excisional biopsy Right 15+ yrs ago    LARGE SEBACEOUS CYST  . Craniotomy Right 09/03/2015    Procedure: Right craniotomy for excision of tumor;  Surgeon: Earnie Larsson, MD;  [facility]: [facility] NEURO ORS;  Service: Neurosurgery;  Laterality: Right;   HPI:  Jo Johnston is a 50 y.o. female with PMH of HTN, DM, Asthma, and bipolar disorder, admitted 7/13 after being found unresponsive. On arrival Jo Johnston was nonverbal and with R eye deviation. 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 Jo Johnston underwent bilateral frontal parietal craniotomy with resection of tumor. Jo Johnston was intubated 7/13 to 7/15. Reportedly Jo Johnston is answering some simple questions but speech is "moderately garbled". Bedside swallow eval ordered to assess swallow function given current cognitive status and post-extubation.    Assessment / Plan / Recommendation Clinical Impression  Jo Johnston had an immediate cough x1 following large sip of thin liquid by straw; no other overt s/s of aspiration noted with thin liquid or puree consistencies. Jo Johnston had L anterior spillage across consistencies. Jo Johnston emotional and agitated during evaluation with lots of moaning and crying, speaking in only 1-2 word utterances when asked what is wrong. Jo Johnston also fast and impulsive with sips and bites of puree, putting Jo Johnston at increased risk of aspiration. Given current cognitive/ post-extubation status, recommend initiating dysphagia 1 diet, nectar  thick liquids, meds crushed in puree, full supervision to assist with self-feeding, check for pocketing on L side. Will continue to follow for diet tolerance/ consider advancement.    Aspiration Risk  Mild aspiration risk;Moderate aspiration risk    Diet Recommendation Dysphagia 1 (Puree);Nectar-thick liquid   Liquid Administration via: Straw Medication Administration: Crushed with puree Supervision: Staff to assist with self feeding;Full supervision/cueing for compensatory strategies Compensations: Slow rate;Small sips/bites;Monitor for anterior loss;Lingual sweep for clearance of pocketing Postural Changes: Seated upright at 90 degrees    Other  Recommendations Oral Care Recommendations: Oral care BID Other Recommendations: Order thickener from pharmacy;Have oral suction available   Follow up Recommendations  Other (comment) (TBD)    Frequency and Duration min 2x/week  2 weeks       Prognosis Prognosis for Safe Diet Advancement: Good Barriers to Reach Goals: Severity of deficits      Swallow Study   General HPI: Jo Johnston is a 50 y.o. female with PMH of HTN, DM, Asthma, and bipolar disorder, admitted 7/13 after being found unresponsive. On arrival Jo Johnston was nonverbal and with R eye deviation. 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 Jo Johnston underwent bilateral frontal parietal craniotomy with resection of tumor. Jo Johnston was intubated 7/13 to 7/15. Reportedly Jo Johnston is answering some simple questions but speech is "moderately garbled". Bedside swallow eval ordered to assess swallow function given current cognitive status and post-extubation.  Type of Study: Bedside Swallow Evaluation Diet Prior to this Study: NPO Temperature Spikes Noted: No Respiratory Status: Room air History of Recent Intubation: Yes Length of Intubations (days): 2 days Date extubated: 09/04/15 Behavior/Cognition: Alert;Agitated Oral  Cavity Assessment: Within Functional Limits Oral Care  Completed by SLP: Yes Oral Cavity - Dentition: Adequate natural dentition Vision: Functional for self-feeding Self-Feeding Abilities: Able to feed self;Needs assist Patient Positioning: Upright in chair Baseline Vocal Quality: Normal Volitional Cough: Weak Volitional Swallow: Able to elicit    Oral/Motor/Sensory Function Overall Oral Motor/Sensory Function: Mild impairment Facial ROM: Reduced left Facial Symmetry: Abnormal symmetry left Facial Strength: Reduced left Lingual ROM: Within Functional Limits Lingual Symmetry: Within Functional Limits   Ice Chips Ice chips: Within functional limits Presentation: Spoon   Thin Liquid Thin Liquid: Impaired Presentation: Cup;Straw Oral Phase Impairments: Reduced labial seal Oral Phase Functional Implications: Left anterior spillage Pharyngeal  Phase Impairments: Multiple swallows;Cough - Immediate (x1 following large bolus)    Nectar Thick Nectar Thick Liquid: Not tested   Honey Thick Honey Thick Liquid: Not tested   Puree Puree: Impaired Presentation: Self Fed;Spoon Oral Phase Impairments: Reduced labial seal Oral Phase Functional Implications: Left anterior spillage Pharyngeal Phase Impairments: Multiple swallows   Solid   GO   Solid: Not tested        Kern Reap, MA, CCC-SLP 09/06/2015,2:07 PM [phone]

## 2015-09-07 ENCOUNTER — Encounter (HOSPITAL_COMMUNITY): Payer: Self-pay | Admitting: Radiology

## 2015-09-07 ENCOUNTER — Inpatient Hospital Stay (HOSPITAL_COMMUNITY): Payer: Medicaid Other

## 2015-09-07 LAB — GLUCOSE, CAPILLARY
GLUCOSE-CAPILLARY: 126 mg/dL — AB (ref 65–99)
GLUCOSE-CAPILLARY: 148 mg/dL — AB (ref 65–99)
GLUCOSE-CAPILLARY: 141 mg/dL — AB (ref 65–99)
GLUCOSE-CAPILLARY: 130 mg/dL — AB (ref 65–99)
GLUCOSE-CAPILLARY: 123 mg/dL — AB (ref 65–99)
GLUCOSE-CAPILLARY: 111 mg/dL — AB (ref 65–99)

## 2015-09-07 LAB — BASIC METABOLIC PANEL
Anion gap: 8 (ref 5–15)
BUN: 17 mg/dL (ref 6–20)
CALCIUM: 8.8 mg/dL — AB (ref 8.9–10.3)
CHLORIDE: 110 mmol/L (ref 101–111)
CO2: 21 mmol/L — ABNORMAL LOW (ref 22–32)
CREATININE: 0.69 mg/dL (ref 0.44–1.00)
Glucose, Bld: 120 mg/dL — ABNORMAL HIGH (ref 65–99)
Potassium: 3.4 mmol/L — ABNORMAL LOW (ref 3.5–5.1)
SODIUM: 139 mmol/L (ref 135–145)

## 2015-09-07 LAB — CBC
HCT: 34.8 % — ABNORMAL LOW (ref 36.0–46.0)
HEMOGLOBIN: 11 g/dL — AB (ref 12.0–15.0)
MCH: 26.3 pg (ref 26.0–34.0)
MCHC: 31.6 g/dL (ref 30.0–36.0)
MCV: 83.1 fL (ref 78.0–100.0)
PLATELETS: 210 10*3/uL (ref 150–400)
RBC: 4.19 MIL/uL (ref 3.87–5.11)
RDW: 13.2 % (ref 11.5–15.5)
WBC: 12.2 10*3/uL — ABNORMAL HIGH (ref 4.0–10.5)

## 2015-09-07 MED ORDER — BISACODYL 10 MG RE SUPP
10.0000 mg | Freq: Once | RECTAL | Status: AC
  Administered 2015-09-07: 10 mg via RECTAL
  Filled 2015-09-07: qty 1

## 2015-09-07 MED ORDER — DEXAMETHASONE SODIUM PHOSPHATE 10 MG/ML IJ SOLN
10.0000 mg | Freq: Once | INTRAMUSCULAR | Status: AC
  Administered 2015-09-07: 10 mg via INTRAVENOUS
  Filled 2015-09-07: qty 1

## 2015-09-07 MED ORDER — FENTANYL CITRATE (PF) 100 MCG/2ML IJ SOLN
12.5000 ug | INTRAMUSCULAR | Status: DC | PRN
  Administered 2015-09-07: 25 ug via INTRAVENOUS
  Administered 2015-09-07 – 2015-09-08 (×10): 50 ug via INTRAVENOUS
  Administered 2015-09-11: 25 ug via INTRAVENOUS
  Administered 2015-09-12: 50 ug via INTRAVENOUS
  Filled 2015-09-07 (×13): qty 2

## 2015-09-07 MED ORDER — HYDROCODONE-ACETAMINOPHEN 5-325 MG PO TABS
1.0000 | ORAL_TABLET | ORAL | Status: DC | PRN
  Administered 2015-09-07 – 2015-09-11 (×16): 2 via ORAL
  Administered 2015-09-11: 1 via ORAL
  Administered 2015-09-11 – 2015-09-12 (×5): 2 via ORAL
  Administered 2015-09-13: 1 via ORAL
  Filled 2015-09-07 (×2): qty 2
  Filled 2015-09-07: qty 1
  Filled 2015-09-07: qty 2
  Filled 2015-09-07: qty 1
  Filled 2015-09-07 (×18): qty 2

## 2015-09-07 MED ORDER — PROCHLORPERAZINE EDISYLATE 5 MG/ML IJ SOLN
10.0000 mg | Freq: Once | INTRAMUSCULAR | Status: AC
  Administered 2015-09-07: 10 mg via INTRAVENOUS
  Filled 2015-09-07: qty 2

## 2015-09-07 NOTE — Progress Notes (Signed)
Physical Therapy Treatment Patient Details Name: Jo Johnston MRN: [id number] DOB: 1965-03-11 Today's Date: 09/07/2015    History of Present Illness 50 year old female with hx HTN, DM, Asthma, and bipolar disorder. Presented 7/13 with headache and AMS. She was intubated for airway protection in ED. CT scan with ICH and possible malignancy. She was admitted by neurology, given mannitol and ultimately taken to OR for crani with resection of intraparenchymal tumor    PT Comments    Patient seen for progression of activity. Today session in conjunction with OT therapist with focus of EOB and trunk control facilitation. Patient tolerated well but continues to required +2 physical assist for all aspects of mobility. Patient OOB to chair upon conclusion of session.  Follow Up Recommendations  CIR;Supervision/Assistance - 24 hour     Equipment Recommendations  Wheelchair (measurements PT);Wheelchair cushion (measurements PT)    Recommendations for Other Services Rehab consult     Precautions / Restrictions Precautions Precautions: Fall Precaution Comments: AMS Restrictions Weight Bearing Restrictions: No    Mobility  Bed Mobility Overal bed mobility: Needs Assistance;+2 for physical assistance;+ 2 for safety/equipment Bed Mobility: Rolling;Supine to Sit Rolling: Max assist;+2 for physical assistance;+2 for safety/equipment   Supine to sit: Max assist;+2 for physical assistance;+2 for safety/equipment     General bed mobility comments: patient able to use RUE to reach for rail, required hand over hand to place on rail. 2 person assist to bring LEs off the bed and elevate trunk to upright, use of chuck pad to rotate trunk to EOB  Transfers Overall transfer level: Needs assistance   Transfers: Sit to/from Stand;Stand Pivot Transfers Sit to Stand: Max assist;+2 physical assistance;+2 safety/equipment Stand pivot transfers: Total assist;+2 physical assistance       General  transfer comment: max assist to power up, some weight through RLE, Total assist for pivot to chair using gait belt and chuck pad with 2 persons assist  Ambulation/Gait                 Stairs            Wheelchair Mobility    Modified Rankin (Stroke Patients Only) Modified Rankin (Stroke Patients Only) Pre-Morbid Rankin Score: No symptoms Modified Rankin: Severe disability     Balance   Sitting-balance support: Feet supported Sitting balance-Leahy Scale: Zero Sitting balance - Comments: max to total assist for sitting balance Postural control: Left lateral lean                          Cognition Arousal/Alertness: Awake/alert Behavior During Therapy: Restless;Anxious Overall Cognitive Status: No family/caregiver present to determine baseline cognitive functioning Area of Impairment: Attention;Memory;Following commands;Safety/judgement;Awareness;Problem solving   Current Attention Level: Sustained   Following Commands: Follows one step commands inconsistently;Follows one step commands with increased time Safety/Judgement: Decreased awareness of safety;Decreased awareness of deficits Awareness: Intellectual Problem Solving: Slow processing;Decreased initiation;Requires verbal cues;Requires tactile cues      Exercises Other Exercises Other Exercises: dynamic trunk control activities performed with max assist. Other Exercises: Sitting and reaching with RUE to engage trunk activation flexion anterior/posterior.    General Comments        Pertinent Vitals/Pain Pain Assessment: Faces Faces Pain Scale: Hurts little more Pain Location: back Pain Descriptors / Indicators: Discomfort;Moaning    Home Living                      Prior Function  PT Goals (current goals can now be found in the care plan section) Acute Rehab PT Goals Patient Stated Goal: to go home PT Goal Formulation: With patient Time For Goal Achievement:  09/20/15 Potential to Achieve Goals: Fair Progress towards PT goals: Progressing toward goals    Frequency  Min 3X/week    PT Plan Current plan remains appropriate    Co-evaluation PT/OT/SLP Co-Evaluation/Treatment: Yes Reason for Co-Treatment: Complexity of the patient's impairments (multi-system involvement);Necessary to address cognition/behavior during functional activity;For patient/therapist safety PT goals addressed during session: Mobility/safety with mobility       End of Session Equipment Utilized During Treatment: Gait belt Activity Tolerance: Patient tolerated treatment well Patient left: in chair;with call bell/phone within reach, family/visitor present    Time: [id number] PT Time Calculation (min) (ACUTE ONLY): 26 min  Charges:  $Therapeutic Activity: 8-22 mins                    G CodesDuncan Dull Sep 15, 2015, 11:36 AM Alben Deeds, PT DPT  [phone]

## 2015-09-07 NOTE — Progress Notes (Signed)
[facility] Progress Note Patient Name: Jo Johnston DOB: December 16, 1965 MRN: [id number]   Date of Service  09/07/2015  HPI/Events of Note  pain  eICU Interventions  Increase range of pain medication     Intervention Category Intermediate Interventions: Pain - evaluation and management  Simonne Maffucci 09/07/2015, 2:26 AM

## 2015-09-07 NOTE — Progress Notes (Addendum)
Speech Language Pathology Treatment: Dysphagia  Patient Details Name: Jo Johnston MRN: [id number] DOB: Mar 16, 1965 Today's Date: 09/07/2015 Time: [id number] SLP Time Calculation (min) (ACUTE ONLY): 12 min  Assessment / Plan / Recommendation Clinical Impression  Dysphagia treatment provided to check for diet tolerance. Jo Johnston initially asleep but awoke easily and was emotional and confused during treatment today, repeatedly asking "what happened?". Jo Johnston tolerated small trials of nectar-thick liquids and puree consistency without overt s/s of aspiration or changes in respiration; moderate cues provided for encouragement/ small sips. Son arrived at bedside halfway through treatment and provided encouragement for Jo Johnston to continue PO trials. Given current cognitive status and continued L side weakness, recommend continuing dysphagia 1 diet/ nectar thick liquids, meds crushed in puree. Will continue to follow to consider advancement.    HPI HPI: Jo Johnston is a 50 y.o. female with PMH of HTN, DM, Asthma, and bipolar disorder, admitted 7/13 after being found unresponsive. On arrival Jo Johnston was nonverbal and with R eye deviation. 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 Jo Johnston underwent bilateral frontal parietal craniotomy with resection of tumor. Jo Johnston was intubated 7/13 to 7/15. Reportedly Jo Johnston is answering some simple questions but speech is "moderately garbled". Bedside swallow eval ordered to assess swallow function given current cognitive status and post-extubation.       SLP Plan  Continue with current plan of care     Recommendations  Diet recommendations: Dysphagia 1 (puree);Nectar-thick liquid Liquids provided via: Straw Medication Administration: Crushed with puree Supervision: Staff to assist with self feeding;Full supervision/cueing for compensatory strategies Compensations: Slow rate;Small sips/bites;Monitor for anterior loss;Lingual sweep for clearance of  pocketing Postural Changes and/or Swallow Maneuvers: Seated upright 90 degrees             General recommendations: Rehab consult Oral Care Recommendations: Oral care BID Follow up Recommendations: Inpatient Rehab Plan: Continue with current plan of care     Plains, Amy K, Gem, CCC-SLP 09/07/2015, 4:04 PM [id number]

## 2015-09-07 NOTE — Care Management Note (Addendum)
Case Management Note  Patient Details  Name: Jo Johnston MRN: [id number] Date of Birth: Jul 03, 1965  Subjective/Objective:    Pt admitted on 09/02/15 with severe HA/ICH/large dural mass. PTA, pt independent, lives with boyfriend/significant other.                7/19- CIR is recommending SNF for patient.  Action/Plan: Pt s/p emergent crani for mass removal , 7/14. Will follow post op for discharge planning needs  Expected Discharge Date:                  Expected Discharge Plan:  [place]  In-House Referral:     Discharge planning Services  CM Consult  Post Acute Care Choice:    Choice offered to:     DME Arranged:    DME Agency:     HH Arranged:    HH Agency:     Status of Service:  In process, will continue to follow  If discussed at Long Length of Stay Meetings, dates discussed:    Additional Comments:  Zenon Mayo, RN 09/07/2015, 9:04 PM

## 2015-09-07 NOTE — Progress Notes (Signed)
@  0200 paged on-call for attending (Dr. Saintclair Halsted via answering service) regarding pt's pain uncontrolled by current PRNs. @0226  paged [facility]-Link (CCM just signed off yesterday) regarding above. PRN pain regimen increased. @0300  after brief relief pt again awakened calling out and saying her head was hurting. Neuro Exam unchanged from previous assessments. On-call for attending repaged @0305 . Awaiting response.-->No return call. @0415  re-paged on call for attending (Dr. Saintclair Halsted via answering service). Regarding pt's continued uncontrolled pain. Pt goes from resting comfortably to moaning in pain and back again despite and uncorrelated to pain medication administration Awaiting response. @0439  Dr. Saintclair Halsted returned paged. Orders received for 1 time doses of Compazine and Decadron. AM CT to reassess craniotomy also ordered. Will administer and continue to assess. Husband of pt at bedside updated and agreeable to current plan.  After administration of above orders, pt's VS normalized, and pt was resting quietly. Will convey to oncoming RN.

## 2015-09-07 NOTE — Evaluation (Signed)
Occupational Therapy Evaluation Patient Details Name: Linelle Haynie MRN: [id number] DOB: Jun 05, 1965 Today's Date: 09/07/2015    History of Present Illness 50 year old female with hx HTN, DM, Asthma, and bipolar disorder. Presented 7/13 with headache and AMS. She was intubated for airway protection in ED. CT scan with ICH and possible malignancy. She was admitted by neurology, given mannitol and ultimately taken to OR for crani with resection of intraparenchymal tumor   Clinical Impression   Patient presenting with decreased ADL and functional mobility independence secondary to above. Patient independent and living with husband and their dog PTA. Patient currently functioning at an overall max to total assist +2 level; max assist +2 for OOB transfer. Patient will benefit from acute OT to increase overall independence in the areas of ADLs, functional mobility, and overall safety in order to safely discharge to venue listed below.     Follow Up Recommendations  CIR;Supervision/Assistance - 24 hour    Equipment Recommendations  Other (comment) (TBD next venue of care)    Recommendations for Other Services  None at this time    Precautions / Restrictions Precautions Precautions: Fall Precaution Comments: AMS Restrictions Weight Bearing Restrictions: No    Mobility Bed Mobility Overal bed mobility: Needs Assistance;+2 for physical assistance;+ 2 for safety/equipment Bed Mobility: Rolling;Supine to Sit Rolling: Max assist;+2 for physical assistance;+2 for safety/equipment   Supine to sit: Max assist;+2 for physical assistance;+2 for safety/equipment     General bed mobility comments: patient able to use RUE to reach for rail, required hand over hand to place on rail. 2 person assist to bring LEs off the bed and elevate trunk to upright, use of chuck pad to rotate trunk to EOB  Transfers Overall transfer level: Needs assistance   Transfers: Sit to/from Stand;Stand Pivot  Transfers Sit to Stand: Max assist;+2 physical assistance;+2 safety/equipment Stand pivot transfers: Total assist;+2 physical assistance       General transfer comment: max assist to power up, some weight through RLE, Total assist for pivot to chair using gait belt and chuck pad with 2 persons assist    Balance Overall balance assessment: Needs assistance Sitting-balance support: Feet supported Sitting balance-Leahy Scale: Zero Sitting balance - Comments: max to total assist for sitting balance Postural control: Left lateral lean     ADL Overall ADL's : Needs assistance/impaired     Grooming: Total assistance;Sitting   Upper Body Bathing: Total assistance;Bed level   Lower Body Bathing: Total assistance;Bed level;+2 for physical assistance   Upper Body Dressing : Total assistance;Bed level   Lower Body Dressing: +2 for physical assistance;Total assistance;Bed level   Toilet Transfer: +2 for physical assistance;Maximal assistance;Stand-pivot Toilet Transfer Details (indicate cue type and reason): simulated EOB to recliner          Functional mobility during ADLs: Maximal assistance;+2 for physical assistance General ADL Comments: Labile during session, increased frustration with husband.     Vision Vision Assessment?: Vision impaired- to be further tested in functional context Additional Comments: Pt with difficulty following visual stimuli with eyes, difficult to fully assess secondary to pt with eyes shut most of session and would not follow command to open them          Pertinent Vitals/Pain Pain Assessment: Faces Faces Pain Scale: Hurts little more Pain Location: back with mobility Pain Descriptors / Indicators: Discomfort;Moaning Pain Intervention(s): Monitored during session;Repositioned     Hand Dominance Right   Extremity/Trunk Assessment Upper Extremity Assessment Upper Extremity Assessment: RUE deficits/detail;LUE deficits/detail RUE  Deficits /  Details: generalized weakness  LUE Deficits / Details: flaccid LUE, no active movement noted. Increased swelling noted on dorsum of hand.  LUE Sensation: decreased light touch;decreased proprioception LUE Coordination: decreased fine motor;decreased gross motor   Lower Extremity Assessment Lower Extremity Assessment: Defer to PT evaluation       Communication Communication Communication: Expressive difficulties   Cognition Arousal/Alertness: Awake/alert Behavior During Therapy: Restless;Anxious Overall Cognitive Status: No family/caregiver present to determine baseline cognitive functioning Area of Impairment: Attention;Memory;Following commands;Safety/judgement;Awareness;Problem solving   Current Attention Level: Sustained   Following Commands: Follows one step commands inconsistently;Follows one step commands with increased time Safety/Judgement: Decreased awareness of safety;Decreased awareness of deficits Awareness: Intellectual Problem Solving: Slow processing;Decreased initiation;Requires verbal cues;Requires tactile cues       Exercises   Other Exercises Other Exercises: dynamic trunk control activities performed with max assist. Other Exercises: Sitting and reaching with RUE to engage trunk activation flexion anterior/posterior.        Home Living Family/patient expects to be discharged to:: Inpatient rehab    Prior Functioning/Environment Comments: unknown    OT Diagnosis: Generalized weakness   OT Problem List: Decreased strength;Decreased activity tolerance;Decreased range of motion;Impaired balance (sitting and/or standing);Impaired vision/perception;Decreased coordination;Decreased cognition;Decreased safety awareness;Decreased knowledge of use of DME or AE;Decreased knowledge of precautions;Impaired sensation;Pain;Impaired tone;Increased edema   OT Treatment/Interventions: Self-care/ADL training;Therapeutic exercise;Neuromuscular education;Energy  conservation;DME and/or AE instruction;Splinting;Therapeutic activities;Patient/family education;Balance training    OT Goals(Current goals can be found in the care plan section) Acute Rehab OT Goals Patient Stated Goal: to go home OT Goal Formulation: With patient/family Time For Goal Achievement: 09/21/15 Potential to Achieve Goals: Good ADL Goals Pt Will Perform Grooming: sitting;with mod assist Pt Will Transfer to Toilet: with mod assist;bedside commode;stand pivot transfer Additional ADL Goal #1: Pt will be mod assist (1 person) with bed mobility as a precursor for ADLs and to decrease burden of care Additional ADL Goal #2: Pt will perform sit to/from stand with mod assist (1 person) as a precursor for ADL and to decrease burden of care Additional ADL Goal #3: Pt will maintain dynamic sitting EOB with min assist ( 1 person) for at least 10 minutes during ADL   OT Frequency: Min 3X/week   Barriers to D/C: None known at this time       Co-evaluation PT/OT/SLP Co-Evaluation/Treatment: Yes Reason for Co-Treatment: Complexity of the patient's impairments (multi-system involvement);For patient/therapist safety PT goals addressed during session: Mobility/safety with mobility OT goals addressed during session: ADL's and self-care;Other (comment) (functional mobility)      End of Session Equipment Utilized During Treatment: Gait belt Nurse Communication: Mobility status  Activity Tolerance: Patient tolerated treatment well Patient left: in chair;with call bell/phone within reach;Other (comment) (lift pad placed under patient for nursing staff)   Time: [id number] OT Time Calculation (min): 27 min Charges:  OT General Charges $OT Visit: 1 Procedure OT Evaluation $OT Eval Moderate Complexity: 1 Procedure  Chrys Racer , MS, OTR/L, CLT Pager: [phone]  09/07/2015, 11:59 AM

## 2015-09-08 DIAGNOSIS — D72829 Elevated white blood cell count, unspecified: Secondary | ICD-10-CM | POA: Insufficient documentation

## 2015-09-08 DIAGNOSIS — E876 Hypokalemia: Secondary | ICD-10-CM | POA: Insufficient documentation

## 2015-09-08 DIAGNOSIS — I69991 Dysphagia following unspecified cerebrovascular disease: Secondary | ICD-10-CM

## 2015-09-08 DIAGNOSIS — I10 Essential (primary) hypertension: Secondary | ICD-10-CM | POA: Insufficient documentation

## 2015-09-08 DIAGNOSIS — C711 Malignant neoplasm of frontal lobe: Secondary | ICD-10-CM

## 2015-09-08 DIAGNOSIS — I611 Nontraumatic intracerebral hemorrhage in hemisphere, cortical: Secondary | ICD-10-CM | POA: Insufficient documentation

## 2015-09-08 DIAGNOSIS — R739 Hyperglycemia, unspecified: Secondary | ICD-10-CM

## 2015-09-08 DIAGNOSIS — F919 Conduct disorder, unspecified: Secondary | ICD-10-CM

## 2015-09-08 DIAGNOSIS — R4689 Other symptoms and signs involving appearance and behavior: Secondary | ICD-10-CM

## 2015-09-08 DIAGNOSIS — R4189 Other symptoms and signs involving cognitive functions and awareness: Secondary | ICD-10-CM

## 2015-09-08 DIAGNOSIS — Z9889 Other specified postprocedural states: Secondary | ICD-10-CM

## 2015-09-08 DIAGNOSIS — F3177 Bipolar disorder, in partial remission, most recent episode mixed: Secondary | ICD-10-CM

## 2015-09-08 LAB — GLUCOSE, CAPILLARY
GLUCOSE-CAPILLARY: 145 mg/dL — AB (ref 65–99)
GLUCOSE-CAPILLARY: 135 mg/dL — AB (ref 65–99)
Glucose-Capillary: 115 mg/dL — ABNORMAL HIGH (ref 65–99)
Glucose-Capillary: 115 mg/dL — ABNORMAL HIGH (ref 65–99)
Glucose-Capillary: 126 mg/dL — ABNORMAL HIGH (ref 65–99)
Glucose-Capillary: 128 mg/dL — ABNORMAL HIGH (ref 65–99)
Glucose-Capillary: 139 mg/dL — ABNORMAL HIGH (ref 65–99)

## 2015-09-08 MED ORDER — FAMOTIDINE 20 MG PO TABS
20.0000 mg | ORAL_TABLET | Freq: Two times a day (BID) | ORAL | Status: DC
  Administered 2015-09-08 – 2015-09-13 (×10): 20 mg via ORAL
  Filled 2015-09-08 (×10): qty 1

## 2015-09-08 MED ORDER — MIDAZOLAM HCL 2 MG/2ML IJ SOLN
2.0000 mg | Freq: Once | INTRAMUSCULAR | Status: AC
  Administered 2015-09-08: 2 mg via INTRAVENOUS
  Filled 2015-09-08: qty 2

## 2015-09-08 MED ORDER — ALPRAZOLAM 0.5 MG PO TABS
2.0000 mg | ORAL_TABLET | Freq: Two times a day (BID) | ORAL | Status: DC
  Administered 2015-09-08 – 2015-09-13 (×11): 2 mg via ORAL
  Filled 2015-09-08 (×7): qty 4
  Filled 2015-09-08: qty 8
  Filled 2015-09-08 (×3): qty 4

## 2015-09-08 NOTE — Consult Note (Signed)
Physical Medicine and Rehabilitation Consult  Reason for Consult: [place] Referring Physician: Dr. Annette Stable   HPI: Jo Johnston is a 50 y.o. female with history of DM?, HTN, bipolar disorder who was developed HA 09/01/15 and admitted on 09/02/15 after being found unresponsive and incontinent by her partner.  At admission,  she was non-verbal, unable to follow commands, had eyes deviated to the right and was intubated for airway protection.  UDS negative. CT head showed acute right frontal hemorrhage 6.1 cm X 4.3 cm with adjacent 4.1 cm X 3.2 cm soft tissue mass suspicious for malignancy and significant left to right shift with intraventricular component.  She was treated with mannitol and IV decadron and Dr.  Annette Stable recommended MRI for better evaluation of mass. MRI brain showed right frontal hematoma with mass effect and vasogenic edema and right enhancing right frontal lobe mass concerning for primary or metastatic disease. She underwent bilateral frontal parietal Crani with resection of intraparenchymal tumor and evacuation of associated hematoma 07/14. She tolerated extubation without difficulty and follow up CT head showed decrease in mass effect.  She has had issues with fluctuating mental status with bouts of lethargy, bouts of crying with decrease in verbal output, expressive deficits with cognitive deficits, dysphagia and flaccid Left hemiparesis.  Prelim pathology with malignant appearing meningioma v/s glioma sarcoma and sent to [facility] for evaluation.   Reports that she moved to [facility] last fall to be closer to grandchildren. Lives with husband.  39 year old son visits. Disabled due to bipolar disorder? Sister from [place] on the way to assist her.   Review of Systems  Unable to perform ROS: severity of pain      Past Medical History  Diagnosis Date  . Hypertension   . Diabetes mellitus without complication ([place])   . Asthma   . Bipolar 1 disorder [facility])     Past  Surgical History  Procedure Laterality Date  . Breast excisional biopsy Right 15+ yrs ago    LARGE SEBACEOUS CYST  . Craniotomy Right 09/03/2015    Procedure: Right craniotomy for excision of tumor;  Surgeon: Earnie Larsson, MD;  Location: [facility] NEURO ORS;  Service: Neurosurgery;  Laterality: Right;    Family History  Problem Relation Age of Onset  . Breast cancer Mother    Social History:  Lives with family and independent PTA. Per reports that she has been smoking--"only after meals".  She does not have any smokeless tobacco history on file. Per reports that she does not drink alcohol. Her drug history is not on file.   Allergies  Allergen Reactions  . Haloperidol Hives  . Depakote [Valproic Acid] Other (See Comments)    Headache  . Lithium Hives  . Risperdal [Risperidone] Other (See Comments)    Headache  . Seroquel [Quetiapine Fumarate] Other (See Comments)    Headache    Medications Prior to Admission  Medication Sig Dispense Refill  . ARIPiprazole (ABILIFY) 20 MG tablet Take 20 mg by mouth daily.    Marland Kitchen ibuprofen (ADVIL,MOTRIN) 200 MG tablet Take 200 mg by mouth every 6 (six) hours as needed for moderate pain.    . traZODone (DESYREL) 150 MG tablet Take 150 mg by mouth at bedtime.      Home: Home Living Family/patient expects to be discharged to:: Inpatient rehab  Functional History: Prior Function Comments: unknown Functional Status:  Mobility: Bed Mobility Overal bed mobility: Needs Assistance, +2 for physical assistance, + 2 for safety/equipment Bed  Mobility: Rolling, Supine to Sit Rolling: Max assist, +2 for physical assistance, +2 for safety/equipment Supine to sit: Max assist, +2 for physical assistance, +2 for safety/equipment General bed mobility comments: patient able to use RUE to reach for rail, required hand over hand to place on rail. 2 person assist to bring LEs off the bed and elevate trunk to upright, use of chuck pad to rotate trunk to  EOB Transfers Overall transfer level: Needs assistance Transfers: Sit to/from Stand, Stand Pivot Transfers Sit to Stand: Max assist, +2 physical assistance, +2 safety/equipment Stand pivot transfers: Total assist, +2 physical assistance General transfer comment: max assist to power up, some weight through RLE, Total assist for pivot to chair using gait belt and chuck pad with 2 persons assist      ADL: ADL Overall ADL's : Needs assistance/impaired Grooming: Total assistance, Sitting Upper Body Bathing: Total assistance, Bed level Lower Body Bathing: Total assistance, Bed level, +2 for physical assistance Upper Body Dressing : Total assistance, Bed level Lower Body Dressing: +2 for physical assistance, Total assistance, Bed level Toilet Transfer: +2 for physical assistance, Maximal assistance, Stand-pivot Toilet Transfer Details (indicate cue type and reason): simulated EOB to recliner  Functional mobility during ADLs: Maximal assistance, +2 for physical assistance General ADL Comments: Labile during session, increased frustration with husband.   Cognition: Cognition Overall Cognitive Status: No family/caregiver present to determine baseline cognitive functioning Orientation Level: Oriented to person, Oriented to place, Oriented to situation Cognition Arousal/Alertness: Awake/alert Behavior During Therapy: Restless, Anxious Overall Cognitive Status: No family/caregiver present to determine baseline cognitive functioning Area of Impairment: Attention, Memory, Following commands, Safety/judgement, Awareness, Problem solving Current Attention Level: Sustained Following Commands: Follows one step commands inconsistently, Follows one step commands with increased time Safety/Judgement: Decreased awareness of safety, Decreased awareness of deficits Awareness: Intellectual Problem Solving: Slow processing, Decreased initiation, Requires verbal cues, Requires tactile cues General Comments:  pt knew she had urinated but did not know to ring for help, patient extermely labile and moaning/crying out with bouts of laughter during session   Blood pressure 122/78, pulse 51, temperature 98 F (36.7 C), temperature source Axillary, resp. rate 12, height 5\' 1"  (1.549 m), weight 50.6 kg (111 lb 8.8 oz), SpO2 100 %. Physical Exam  Nursing note and vitals reviewed. Constitutional: She is oriented to person, place, and time. She appears well-developed and well-nourished. She appears lethargic. She appears distressed.  Writhing in pain  HENT:  Mouth/Throat: Oropharynx is clear and moist.  Right crani incision with staples intact--clean/dry/intact.   Eyes: Conjunctivae and EOM are normal. Right eye exhibits no discharge.  Pupils pinpoint. She was able to turn eyes to right field with cues.   Neck: Normal range of motion. Neck supple.  Cardiovascular: Normal rate and regular rhythm.   No murmur heard. Respiratory: Effort normal and breath sounds normal. No stridor. No respiratory distress. She has no wheezes.  GI: Soft. Bowel sounds are normal. She exhibits no distension. There is no tenderness.  Musculoskeletal: She exhibits edema (1+ edema left forearm). She exhibits no tenderness.  Neurological: She is oriented to person, place, and time. She appears lethargic.  Dysarthria  Extensor tone LLE.   Poor frustration tolerance with poor insight/awareness of deficits.  Sensation diminished to light touch LUE/LLE Motor: RUE: 4+/5 proximal to distal RLE: 4/5 proximal to distal LUE/LLE: 0/5 Dtrs 3+ LUE/LLE  Skin: Skin is warm and dry. No rash noted. She is not diaphoretic. No erythema.  Psychiatric: Her speech is tangential. She is  agitated. Cognition and memory are impaired. She expresses impulsivity and inappropriate judgment. She is inattentive.    Results for orders placed or performed during the hospital encounter of 09/02/15 (from the past 24 hour(s))  Glucose, capillary     Status:  Abnormal   Collection Time: 09/07/15 11:26 AM  Result Value Ref Range   Glucose-Capillary 141 (H) 65 - 99 mg/dL   Comment 1 Notify RN    Comment 2 Document in Chart   Glucose, capillary     Status: Abnormal   Collection Time: 09/07/15  3:13 PM  Result Value Ref Range   Glucose-Capillary 130 (H) 65 - 99 mg/dL   Comment 1 Notify RN    Comment 2 Document in Chart   Glucose, capillary     Status: Abnormal   Collection Time: 09/07/15  7:08 PM  Result Value Ref Range   Glucose-Capillary 123 (H) 65 - 99 mg/dL  Glucose, capillary     Status: Abnormal   Collection Time: 09/07/15 11:26 PM  Result Value Ref Range   Glucose-Capillary 111 (H) 65 - 99 mg/dL  Glucose, capillary     Status: Abnormal   Collection Time: 09/08/15  3:04 AM  Result Value Ref Range   Glucose-Capillary 126 (H) 65 - 99 mg/dL  Glucose, capillary     Status: Abnormal   Collection Time: 09/08/15  8:54 AM  Result Value Ref Range   Glucose-Capillary 115 (H) 65 - 99 mg/dL   Comment 1 Notify RN    Comment 2 Document in Chart    Ct Head Wo Contrast  09/07/2015  CLINICAL DATA:  Followup craniotomy for right frontal intraparenchymal hematoma. EXAM: CT HEAD WITHOUT CONTRAST TECHNIQUE: Contiguous axial images were obtained from the base of the skull through the vertex without intravenous contrast. COMPARISON:  09/04/2015.  09/02/2015. FINDINGS: Previous right frontal craniotomy for evacuation of large right frontal intraparenchymal hematoma. There is only a small amount of hyperdense blood in this region presently, with no evidence of rebleeding since the surgery. Small amount of fluid and air in the subdural space on the right is unchanged, maximal thickness 5 mm. Intraventricular blood layering dependently is unchanged. Clot in the frontal horn of the right lateral ventricle, or possibly subependymal, is unchanged. Ventricular size is stable. Right-to-left midline shift of 8.4 mm is unchanged. No new or worsening finding. IMPRESSION:  Fairly stable exam since yesterday. No evidence of rebleeding in the right frontal lobe. Edema and mass effect are approximately the same with right-to-left shift of 8.4 mm. Thin amount of subdural fluid and air on the right is unchanged. Intraventricular blood and ventricular size are unchanged. Electronically Signed   By: Nelson Chimes M.D.   On: 09/07/2015 07:05    Assessment/Plan: Diagnosis: Acute right frontal hemorrhage due to CA Labs and images independently reviewed.  Records reviewed and summated above. Stroke: Continue secondary stroke prophylaxis and Risk Factor Modification listed below:   Blood Pressure Management:  Continue current medication with prn's with permisive HTN per primary team Hyperglycemia Tobacco abuse:   Left sided hemiparesis: fit for orthosis to prevent contractures (resting hand splint for day, wrist cock up splint at night, PRAFO, etc Motor recovery: Fluoxetine per Psych given Bipolar hx   1. Does the need for close, 24 hr/day medical supervision in concert with the patient's rehab needs make it unreasonable for this patient to be served in a less intensive setting? Yes  2. Co-Morbidities requiring supervision/potential complications: hyperglycemia (check HbA1c), HTN (monitor and provide prns  in accordance with increased physical exertion and pain), bipolar disorder (cont meds), fluctuating mental status (reorient, lights on/blinds up during the day, closed at night), bouts of lethargy (avoid sedative meds), bouts of crying (?PBA), in verbal output, expressive deficits with cognitive deficits, dysphagia (cont SLP, advance diet as tolerated), hypokalemia (continue to monitor and replete as necessary), leukocytosis (cont to monitor for signs and symptoms of infection, further workup if indicated) 3. Due to bladder management, safety, skin/wound care, disease management, medication administration, pain management and patient education, does the patient require 24 hr/day  rehab nursing? Yes 4. Does the patient require coordinated care of a physician, rehab nurse, PT (1-2 hrs/day, 5 days/week), OT (1-2 hrs/day, 5 days/week) and SLP (1-2 hrs/day, 5 days/week) to address physical and functional deficits in the context of the above medical diagnosis(es)? Yes Addressing deficits in the following areas: balance, endurance, locomotion, strength, transferring, bowel/bladder control, bathing, dressing, feeding, grooming, toileting, cognition, speech, language, swallowing and psychosocial support 5. Can the patient actively participate in an intensive therapy program of at least 3 hrs of therapy per day at least 5 days per week? Not at present 6. The potential for patient to make measurable gains while on inpatient rehab is excellent 7. Anticipated functional outcomes upon discharge from inpatient rehab are mod assist and max assist  with PT, mod assist and max assist with OT, min assist and mod assist with SLP. 8. Estimated rehab length of stay to reach the above functional goals is: 20-25 days. 9. Does the patient have adequate social supports and living environment to accommodate these discharge functional goals? Potentially 10. Anticipated D/C setting: Other 11. Anticipated post D/C treatments: SNF 12. Overall Rehab/Functional Prognosis: good and fair  RECOMMENDATIONS: This patient's condition is appropriate for continued rehabilitative care in the following setting: Given current state, pt will likely require significant support at discharge.  Further, she is not able to tolerate 3 hours therapy/day.  Recommend SNF once medical issues stabalize.  Patient has agreed to participate in recommended program. Potentially Note that insurance prior authorization may be required for reimbursement for recommended care.  Comment: Rehab Admissions Coordinator to follow up.  Delice Lesch, MD 09/08/2015

## 2015-09-08 NOTE — Progress Notes (Signed)
Overall stable. Patient remains very tearful and emotional. Her speech is fluent. Her wound is healing well. Her left facial and left upper extremity weakness is stable. Right side is strong. Following commands well. Follow-up head CT scan yesterday looked good. Pathology still pending. Preliminary results suggest malignant-appearing meningioma versus possible glioma sarcoma. Studies of been sent out to [facility] for further evaluation.  Overall stable. Continue supportive efforts and steroids. Discharge plan CIR.

## 2015-09-09 LAB — GLUCOSE, CAPILLARY
Glucose-Capillary: 124 mg/dL — ABNORMAL HIGH (ref 65–99)
Glucose-Capillary: 106 mg/dL — ABNORMAL HIGH (ref 65–99)
Glucose-Capillary: 163 mg/dL — ABNORMAL HIGH (ref 65–99)
Glucose-Capillary: 151 mg/dL — ABNORMAL HIGH (ref 65–99)
Glucose-Capillary: 127 mg/dL — ABNORMAL HIGH (ref 65–99)

## 2015-09-09 MED ORDER — BISACODYL 10 MG RE SUPP
10.0000 mg | Freq: Every day | RECTAL | Status: DC | PRN
  Administered 2015-09-09 – 2015-09-13 (×3): 10 mg via RECTAL
  Filled 2015-09-09 (×3): qty 1

## 2015-09-09 NOTE — Clinical Social Work Placement (Signed)
   CLINICAL SOCIAL WORK PLACEMENT  NOTE  Date:  09/09/2015  Patient Details  Name: Marise Kosman MRN: [id number] Date of Birth: 11-10-1965  Clinical Social Work is seeking post-discharge placement for this patient at the [place] level of care (*CSW will initial, date and re-position this form in  chart as items are completed):  Yes   Patient/family provided with [place] Work Department's list of facilities offering this level of care within the geographic area requested by the patient (or if unable, by the patient's family).  Yes   Patient/family informed of their freedom to choose among providers that offer the needed level of care, that participate in Medicare, Medicaid or managed care program needed by the patient, have an available bed and are willing to accept the patient.  Yes   Patient/family informed of [facility]'s ownership interest in [facility] and [facility], as well as of the fact that they are under no obligation to receive care at these facilities.  PASRR submitted to EDS on 09/09/15     PASRR number received on 09/09/15     Existing PASRR number confirmed on       FL2 transmitted to all facilities in geographic area requested by pt/family on 09/09/15     FL2 transmitted to all facilities within larger geographic area on       Patient informed that his/her managed care company has contracts with or will negotiate with certain facilities, including the following:            Patient/family informed of bed offers received.  Patient chooses bed at       Physician recommends and patient chooses bed at      Patient to be transferred to   on  .  Patient to be transferred to facility by       Patient family notified on   of transfer.  Name of family member notified:        PHYSICIAN Please sign FL2     Additional Comment:    _______________________________________________ Benard Halsted,  [place] 09/09/2015, 9:14 AM

## 2015-09-09 NOTE — Progress Notes (Signed)
No new issues.  Neuro stable. Wd healing well. Path pending  DC planning ongoing

## 2015-09-09 NOTE — NC FL2 (Signed)
[place] LEVEL OF CARE SCREENING TOOL     IDENTIFICATION  Patient Name: Jo Johnston Birthdate: [birth year] Sex: female Admission Date (Current Location): 09/02/2015  [facility] and [place] Number:  Herbalist and Address:  [facility], [place] [address] [employer], [place] [address]      Provider Number: [id number]  Attending Physician Name and Address:  Earnie Larsson, MD  Relative Name and Phone Number:       Current Level of Care: Hospital Recommended Level of Care: [place] Prior Approval Number:    Date Approved/Denied:   PASRR Number: [id number]  Discharge Plan: SNF    Current Diagnoses: Patient Active Problem List   Diagnosis Date Noted  . Nontraumatic cortical hemorrhage of right cerebral hemisphere ([place])   . Status post craniotomy   . Hyperglycemia   . Benign essential HTN   . Bipolar disorder, in partial remission, most recent episode mixed ([place])   . Cognitive and behavioral changes   . Dysphagia as late effect of cerebrovascular disease   . Hypokalemia   . Leukocytosis   . Malignant neoplasm of frontal lobe ([place])   . ICH (intracerebral hemorrhage) ([place]) 09/02/2015  . Acute respiratory failure with hypercapnia ([facility])     Orientation RESPIRATION BLADDER Height & Weight     Self, Time, Situation, Place  Normal Incontinent Weight: 50.6 kg (111 lb 8.8 oz) Height:  _0  (154.9 cm)  BEHAVIORAL SYMPTOMS/MOOD NEUROLOGICAL BOWEL NUTRITION STATUS      Continent Diet (Please see DC Summary)  AMBULATORY STATUS COMMUNICATION OF NEEDS Skin   Extensive Assist Verbally Surgical wounds (Closed incision on head)                       Personal Care Assistance Level of Assistance  Bathing, Feeding, Dressing Bathing Assistance: Maximum assistance Feeding assistance: Limited assistance Dressing Assistance: Limited assistance     Functional Limitations Info             SPECIAL CARE FACTORS  FREQUENCY  PT (By licensed PT), OT (By licensed OT)     PT Frequency: 5x/week OT Frequency: 3x/week            Contractures      Additional Factors Info  Code Status, Allergies, Psychotropic, Insulin Sliding Scale Code Status Info: Full Allergies Info: Haloperidol, Depakote, Lithium, Risperdal, Seroquel Psychotropic Info: Xanax Insulin Sliding Scale Info: insulin aspart (novoLOG) injection 0-9 Units       Current Medications (09/09/2015):  This is the current hospital active medication list Current Facility-Administered Medications  Medication Dose Route Frequency Provider Last Rate Last Dose  . 0.9 % NaCl with KCl 20 mEq/ L  infusion   Intravenous Continuous Earnie Larsson, MD 75 mL/hr at 09/09/15 0431    . acetaminophen (TYLENOL) tablet 650 mg  650 mg Oral Q4H PRN Earnie Larsson, MD   650 mg at 09/04/15 0003   Or  . acetaminophen (TYLENOL) suppository 650 mg  650 mg Rectal Q4H PRN Earnie Larsson, MD   650 mg at 09/04/15 1618  . albuterol (PROVENTIL) (2.5 MG/3ML) 0.083% nebulizer solution 2.5 mg  2.5 mg Nebulization Q2H PRN Corey Harold, NP      . ALPRAZolam Duanne Moron) tablet 2 mg  2 mg Oral BID Kevan Ny Ditty, MD   2 mg at 09/08/15 2300  . antiseptic oral rinse solution (CORINZ)  7 mL Mouth Rinse QID Corey Harold, NP   7  mL at 09/08/15 1600  . chlorhexidine gluconate (SAGE KIT) (PERIDEX) 0.12 % solution 15 mL  15 mL Mouth Rinse BID Corey Harold, NP   15 mL at 09/08/15 2056  . dexamethasone (DECADRON) injection 2 mg  2 mg Intravenous Q12H Earnie Larsson, MD   2 mg at 09/08/15 2326  . famotidine (PEPCID) tablet 20 mg  20 mg Oral BID Earnie Larsson, MD   20 mg at 09/08/15 2300  . feeding supplement (ENSURE ENLIVE) (ENSURE ENLIVE) liquid 237 mL  237 mL Oral BID BM Earnie Larsson, MD   237 mL at 09/08/15 1000  . fentaNYL (SUBLIMAZE) injection 12.5-50 mcg  12.5-50 mcg Intravenous Q2H PRN Juanito Doom, MD   50 mcg at 09/08/15 2309  . hydrALAZINE (APRESOLINE) injection 10-40 mg  10-40 mg  Intravenous Q4H PRN Corey Harold, NP   40 mg at 09/08/15 0130  . HYDROcodone-acetaminophen (NORCO/VICODIN) 5-325 MG per tablet 1-2 tablet  1-2 tablet Oral Q4H PRN Juanito Doom, MD   2 tablet at 09/09/15 0256  . insulin aspart (novoLOG) injection 0-9 Units  0-9 Units Subcutaneous Q4H Javier Glazier, MD   1 Units at 09/08/15 1416  . levETIRAcetam (KEPPRA) 500 mg in sodium chloride 0.9 % 100 mL IVPB  500 mg Intravenous Q12H Earnie Larsson, MD   500 mg at 09/09/15 0434  . ondansetron (ZOFRAN) injection 4 mg  4 mg Intravenous Q4H PRN Earnie Larsson, MD      . promethazine (PHENERGAN) tablet 12.5-25 mg  12.5-25 mg Oral Q4H PRN Earnie Larsson, MD      . senna-docusate (Senokot-S) tablet 1 tablet  1 tablet Oral BID Marliss Coots, PA-C   1 tablet at 09/08/15 2300     Discharge Medications: Please see discharge summary for a list of discharge medications.  Relevant Imaging Results:  Relevant Lab Results:   Additional Information SSN: [id number] [place], [place]

## 2015-09-09 NOTE — Progress Notes (Signed)
Nutrition Follow-up   INTERVENTION:  Continue Ensure Enlive po BID, each supplement provides 350 kcal and 20 grams of protein Monitor PO intake for adequacy   NUTRITION DIAGNOSIS:   Inadequate oral intake related to dysphagia, lethargy/confusion as evidenced by meal completion < 50%.  Ongoing  GOAL:   Patient will meet greater than or equal to 90% of their needs  Being met/improving  MONITOR:   PO intake, Supplement acceptance, I & O's, Diet advancement  REASON FOR ASSESSMENT:   Consult Enteral/tube feeding initiation and management  ASSESSMENT:   [name] is a 50 y.o. female with history of HTN, DB, asthma, bipolar and cigarette smoking found unresponsive. CT showed a R frontal ICH with IVH, ? underlying mass.  Pt alert and sitting up in chair. Stated that she was hungry. RD provided applesauce which pt ate slowly on her own. Pt states that she has a good appetite and that she slept through lunch, however lunch tray at bedside with approximately 25% consumed as well as an empty bottle of Ensure. Pt reports liking the Ensure. Per nursing notes, pt ate 40% of lunch and 100% of breakfast yesterday. Pt's weight is down 1 lb from admission weight of 112 lbs. Pt was advanced to a Dysphagia 2 diet with thin liquids earlier today.   Labs reviewed.   Diet Order:  DIET DYS 2 Room service appropriate?: Yes; Fluid consistency:: Thin  Skin:  Reviewed, no issues (closed incision on head)  Last BM:  7/19  Height:   Ht Readings from Last 1 Encounters:  09/06/15 5' 1"  (1.549 m)    Weight:   Wt Readings from Last 1 Encounters:  09/08/15 111 lb 8.8 oz (50.6 kg)    Ideal Body Weight:  47.7 kg  BMI:  Body mass index is 21.09 kg/(m^2).  Estimated Nutritional Needs:   Kcal:  1500-1700  Protein:  75-90 grams  Fluid:  > 1.5 L/day  EDUCATION NEEDS:   No education needs identified at this time  [place], LDN Inpatient Clinical Dietitian Pager:  [phone] After Hours Pager: [phone]

## 2015-09-09 NOTE — Progress Notes (Signed)
Speech Language Pathology Treatment: Dysphagia  Patient Details Name: Jo Johnston MRN: [id number] DOB: 10-Nov-1965 Today's Date: 09/09/2015 Time: [id number] SLP Time Calculation (min) (ACUTE ONLY): 24 min  Assessment / Plan / Recommendation Clinical Impression  Dysphagia treatment focused on swallow function and safety for upgrade. Followed directions; moderate intermittent verbal cues to attend to task. Consumed approximately 6 oz thin water/milk via cup/straw with one delayed throat clear. Mildly less impulsive compared to prior notes, however continues to require full supervision. Mildly prolonged mastication with solid. Upgraded texture to Dys 2 and thin liquids, straws allowed with small sips, pills whole in applesauce and full supervision, continued ST.    HPI HPI: Pt is a 50 y.o. female with PMH of HTN, DM, Asthma, and bipolar disorder, admitted 7/13 after being found unresponsive. On arrival pt was nonverbal and with R eye deviation. 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 Pt underwent bilateral frontal parietal craniotomy with resection of tumor. Pt was intubated 7/13 to 7/15. Reportedly pt is answering some simple questions but speech is "moderately garbled". Bedside swallow eval ordered to assess swallow function given current cognitive status and post-extubation.       SLP Plan  Continue with current plan of care     Recommendations  Diet recommendations: Dysphagia 2 (fine chop);Thin liquid Liquids provided via: Cup;Straw Medication Administration: Crushed with puree Supervision: Staff to assist with self feeding;Full supervision/cueing for compensatory strategies Compensations: Slow rate;Small sips/bites;Lingual sweep for clearance of pocketing;Monitor for anterior loss Postural Changes and/or Swallow Maneuvers: Seated upright 90 degrees             Oral Care Recommendations: Oral care BID Follow up Recommendations: Skilled Nursing  facility Plan: Continue with current plan of care                    Jo Johnston 09/09/2015, 10:34 AM   Jo Johnston Jo Johnston Jo Johnston.Ed [employer] [phone]

## 2015-09-09 NOTE — Progress Notes (Signed)
Physical Therapy Treatment Patient Details Name: Jo Johnston MRN: [id number] DOB: 01/22/1966 Today's Date: 09/09/2015    History of Present Illness 50 year old female with hx HTN, DM, Asthma, and bipolar disorder. Presented 7/13 with headache and AMS. She was intubated for airway protection in ED. CT scan with ICH and possible malignancy. She was admitted by neurology, given mannitol and ultimately taken to OR for crani with resection of intraparenchymal tumor    PT Comments    Pt very lethargic today with variable responses to commands, cues and activities.  Emphasis on transitions, sitting balance and transfers to/from [facility] and chair with pericare as activity.  Very limited ability to assist at this point.   Follow Up Recommendations  CIR;Supervision/Assistance - 24 hour     Equipment Recommendations  Wheelchair (measurements PT);Wheelchair cushion (measurements PT)    Recommendations for Other Services Rehab consult     Precautions / Restrictions Precautions Precautions: Fall    Mobility  Bed Mobility Overal bed mobility: Needs Assistance Bed Mobility: Supine to Sit     Supine to sit: Total assist;+2 for physical assistance     General bed mobility comments: Tried to get pt to use her R UE to assist, but with limited success  Transfers Overall transfer level: Needs assistance   Transfers: Sit to/from Stand;Squat Pivot Transfers Sit to Stand: Max assist;Total assist;+2 physical assistance   Squat pivot transfers: +2 physical assistance;Total assist     General transfer comment: pt unable to get her Right side to assist in standing consistently  Ambulation/Gait                 Stairs            Wheelchair Mobility    Modified Rankin (Stroke Patients Only) Modified Rankin (Stroke Patients Only) Pre-Morbid Rankin Score: No symptoms Modified Rankin: Severe disability     Balance Overall balance assessment: Needs  assistance Sitting-balance support: No upper extremity supported;Feet supported Sitting balance-Leahy Scale: Poor Sitting balance - Comments: slow to list out of midline, but unable to consistently hold midline       Standing balance comment: stood short periods for pericare with little assist from patient                    Cognition Arousal/Alertness: Lethargic Behavior During Therapy: Restless Overall Cognitive Status: No family/caregiver present to determine baseline cognitive functioning Area of Impairment: Attention;Memory;Following commands;Safety/judgement;Awareness;Problem solving   Current Attention Level: Sustained   Following Commands: Follows one step commands inconsistently;Follows one step commands with increased time Safety/Judgement: Decreased awareness of safety;Decreased awareness of deficits Awareness: Intellectual Problem Solving: Slow processing;Decreased initiation;Difficulty sequencing;Requires verbal cues;Requires tactile cues      Exercises Other Exercises Other Exercises: p/aarom exercise to LE's Other Exercises: trunk control activity in sitting    General Comments        Pertinent Vitals/Pain Pain Assessment: Faces Faces Pain Scale: Hurts even more Pain Location: head/neck, o/w vague Pain Descriptors / Indicators: Grimacing;Sore Pain Intervention(s): Monitored during session;Repositioned    Home Living                      Prior Function            PT Goals (current goals can now be found in the care plan section) Acute Rehab PT Goals Patient Stated Goal: to go home PT Goal Formulation: With patient Time For Goal Achievement: 09/20/15 Potential to Achieve Goals: Fair Progress towards PT goals:  Not progressing toward goals - comment    Frequency  Min 3X/week    PT Plan Current plan remains appropriate    Co-evaluation             End of Session     Patient left: in chair;with call bell/phone within  reach;with chair alarm set (lift pad)     Time: 1411-1450 PT Time Calculation (min) (ACUTE ONLY): 39 min  Charges:  $Therapeutic Activity: 23-37 mins $Neuromuscular Re-education: 8-22 mins                    G Codes:      Jo Johnston, Jo Johnston 09/09/2015, 3:56 PM 09/09/2015  Donnella Sham, PT [phone]  (pager)

## 2015-09-09 NOTE — Clinical Social Work Note (Signed)
Clinical Social Work Assessment  Patient Details  Name: Jo Johnston MRN: [id number] Date of Birth: 1965/03/18  Date of referral:  09/09/15               Reason for consult:  Facility Placement                Permission sought to share information with:  Facility Sport and exercise psychologist, Family Supports Permission granted to share information::  Yes, Verbal Permission Granted  Name::     Music therapist::  SNFs  Relationship::  Sister  Contact Information:  [phone]  Housing/Transportation Living arrangements for the past 2 months:  Single Family Home Source of Information:  Patient, Other (Comment Required) (Sister) Patient Interpreter Needed:  None Criminal Activity/Legal Involvement Pertinent to Current Situation/Hospitalization:  No - Comment as needed Significant Relationships:  Siblings Lives with:  Self Do you feel safe going back to the place where you live?  No Need for family participation in patient care:  Yes (Comment)  Care giving concerns:  CSW received referral for possible SNF placement at time of discharge. CSW met with patient and spoke with patient's sister regarding PT recommendation of SNF placement at time of discharge. Patient is very tearful regarding her medical state. Per patient's sister, patient currently has no home support and is unable to care for herself given patient's current physical needs. Patient and patient's sister expressed understanding of PT recommendation and are agreeable to SNF placement at time of discharge. CSW to continue to follow and assist with discharge planning needs.   Social Worker assessment / plan:  CSW spoke with patient and patient's sister concerning possibility of rehab at [facility] before returning home.  Employment status:  Disabled (Comment on whether or not currently receiving Disability) Insurance information:  Medicaid In [place] PT Recommendations:  [place] / Referral to community  resources:  [place]  Patient/Family's Response to care:  Patient and patient's sister recognize need for rehab before returning home and are agreeable to a SNF. Patient reported preference for [facility], but is aware that there are a limited number of Medicaid beds. Patient's sister reports being very determined to make sure her sister has the best care. She reports understanding that patient cannot handle three hours of therapy in CIR.  Patient/Family's Understanding of and Emotional Response to Diagnosis, Current Treatment, and Prognosis:  Patient is realistic regarding therapy needs. No questions/concerns about plan or treatment.    Emotional Assessment Appearance:  Appears stated age Attitude/Demeanor/Rapport:  Crying Affect (typically observed):  Tearful/Crying Orientation:  Oriented to Self, Oriented to Place, Oriented to  Time, Oriented to Situation Alcohol / Substance use:  Not Applicable Psych involvement (Current and /or in the community):  No (Comment)  Discharge Needs  Concerns to be addressed:  Care Coordination Readmission within the last 30 days:  No Current discharge risk:  None Barriers to Discharge:  Continued Medical Work up   [employer], [place] 09/09/2015, 11:14 AM

## 2015-09-09 NOTE — Progress Notes (Signed)
Discussed with SW. Pt not at a level to tolerate intense therapies at this time. Recommend SNF. I will continue to follow her progress with therapies for improved tolerance capabilities. [id number]

## 2015-09-10 LAB — GLUCOSE, CAPILLARY
GLUCOSE-CAPILLARY: 113 mg/dL — AB (ref 65–99)
GLUCOSE-CAPILLARY: 123 mg/dL — AB (ref 65–99)
GLUCOSE-CAPILLARY: 129 mg/dL — AB (ref 65–99)
GLUCOSE-CAPILLARY: 134 mg/dL — AB (ref 65–99)
GLUCOSE-CAPILLARY: 140 mg/dL — AB (ref 65–99)
Glucose-Capillary: 128 mg/dL — ABNORMAL HIGH (ref 65–99)

## 2015-09-10 MED ORDER — LEVETIRACETAM 500 MG PO TABS
500.0000 mg | ORAL_TABLET | Freq: Two times a day (BID) | ORAL | Status: DC
Start: 2015-09-10 — End: 2015-09-13
  Administered 2015-09-10 – 2015-09-13 (×7): 500 mg via ORAL
  Filled 2015-09-10 (×7): qty 1

## 2015-09-10 NOTE — Progress Notes (Signed)
Pt appropriate for SNF rehab . We will sign off. [phone]

## 2015-09-10 NOTE — Progress Notes (Signed)
Physical Therapy Treatment Patient Details Name: Jo Johnston MRN: [id number] DOB: 03/14/1965 Today's Date: 09/10/2015    History of Present Illness 50 year old female with hx HTN, DM, Asthma, and bipolar disorder. Presented 7/13 with headache and AMS. She was intubated for airway protection in ED. CT scan with ICH and possible malignancy. She was admitted by neurology, given mannitol and ultimately taken to OR for crani with resection of intraparenchymal tumor    PT Comments    Initially more alert and minimally more able to follow direction and participate.  Responses were more appropriate.  Emphasis on mobility as it related to getting toileted and cleaned up.  Worked on active assist head positioning with pt able to move hear head inconsistently into an aligned position.   Follow Up Recommendations  CIR;Supervision/Assistance - 24 hour     Equipment Recommendations  Wheelchair (measurements PT);Wheelchair cushion (measurements PT)    Recommendations for Other Services Rehab consult     Precautions / Restrictions Precautions Precautions: Fall Precaution Comments: AMS Restrictions Weight Bearing Restrictions: No    Mobility  Bed Mobility Overal bed mobility: Needs Assistance Bed Mobility: Supine to Sit           General bed mobility comments: Pt OOB in chair upon arrival.  Transfers Overall transfer level: Needs assistance   Transfers: Sit to/from Stand;Squat Pivot Transfers Sit to Stand: Max assist;+2 physical assistance Stand pivot transfers: Max assist;+2 physical assistance       General transfer comment: With cuing and forcing w/bearing onto the R LE, she was able to "kick it in".  Ambulation/Gait                 Stairs            Wheelchair Mobility    Modified Rankin (Stroke Patients Only) Modified Rankin (Stroke Patients Only) Pre-Morbid Rankin Score: No symptoms Modified Rankin: Severe disability     Balance Overall  balance assessment: Needs assistance Sitting-balance support: Feet supported;Feet unsupported;Single extremity supported Sitting balance-Leahy Scale: Poor Sitting balance - Comments: still can not hold midline and especially not in the chair with seat raked back from fron to back. Postural control: Left lateral lean   Standing balance-Leahy Scale: Zero Standing balance comment: stood at edge of BSC to complete pericare and backside washup.  Initially no assist given by pt then with cues and consistent assist pt initiated weak stance on the R LE                    Cognition Arousal/Alertness: Lethargic Behavior During Therapy:  (tearful) Overall Cognitive Status: No family/caregiver present to determine baseline cognitive functioning Area of Impairment: Attention;Following commands;Safety/judgement;Awareness;Problem solving   Current Attention Level: Sustained   Following Commands: Follows one step commands consistently;Follows one step commands with increased time Safety/Judgement: Decreased awareness of safety;Decreased awareness of deficits Awareness: Intellectual Problem Solving: Slow processing;Decreased initiation;Requires verbal cues;Requires tactile cues General Comments: Pt alert at start of session with increased lethargy/fatigue toward end of session. Pt reports RN "just gave her something"    Exercises      General Comments General comments (skin integrity, edema, etc.): Concentrated on cervical ROM and head position.  Pt able to move her head in appropriat planes, mub not hold it there.      Pertinent Vitals/Pain Pain Assessment: Faces Faces Pain Scale: Hurts little more Pain Location: head Pain Descriptors / Indicators: Aching;Guarding Pain Intervention(s): Monitored during session;Limited activity within patient's tolerance;Repositioned    Home  Living                      Prior Function            PT Goals (current goals can now be found in  the care plan section) Acute Rehab PT Goals Patient Stated Goal: none stated PT Goal Formulation: With patient Time For Goal Achievement: 09/20/15 Potential to Achieve Goals: Fair Progress towards PT goals: Progressing toward goals (minute steps)    Frequency  Min 3X/week    PT Plan Current plan remains appropriate    Co-evaluation PT/OT/SLP Co-Evaluation/Treatment: Yes Reason for Co-Treatment: Complexity of the patient's impairments (multi-system involvement) PT goals addressed during session: Mobility/safety with mobility OT goals addressed during session: ADL's and self-care     End of Session   Activity Tolerance: Patient tolerated treatment well Patient left: in chair;with call bell/phone within reach;with chair alarm set     Time: [id number] PT Time Calculation (min) (ACUTE ONLY): 25 min  Charges:  $Therapeutic Activity: 8-22 mins                    G Codes:      Labrittany Wechter, [name] 09/10/2015, 4:16 PM  09/10/2015  Donnella Sham, PT [phone]  (pager)

## 2015-09-10 NOTE — Progress Notes (Signed)
Overall stable. Still with some headache. Still remains very emotional. Left-sided hemiparesis unchanged. Wound healing well. Pathology still pending. Discharge planning points to SNF placement. Patient essentially ready for SNF when bed available.

## 2015-09-10 NOTE — Progress Notes (Signed)
Occupational Therapy Treatment Patient Details Name: Jo Johnston MRN: [id number] DOB: 09/20/1965 Today's Date: 09/10/2015    History of present illness 49 year old female with hx HTN, DM, Asthma, and bipolar disorder. Presented 7/13 with headache and AMS. She was intubated for airway protection in ED. CT scan with ICH and possible malignancy. She was admitted by neurology, given mannitol and ultimately taken to OR for crani with resection of intraparenchymal tumor   OT comments  Pt making progress toward OT goals this session. Pt assisting with UB/LB bathing, and UB dressing but requires max assist overall. Pt currently max assist +2 for basic transfers. Pt with improved head control this session but cannot maintain head at midline for more than a few seconds. Updated d/c plan to SNF for follow up. Will continue to follow acutely.   Follow Up Recommendations  SNF;Supervision/Assistance - 24 hour    Equipment Recommendations  Other (comment) (TBD at next venue)    Recommendations for Other Services      Precautions / Restrictions Precautions Precautions: Fall Restrictions Weight Bearing Restrictions: No       Mobility Bed Mobility               General bed mobility comments: Pt OOB in chair upon arrival.  Transfers Overall transfer level: Needs assistance   Transfers: Sit to/from Stand;Stand Pivot Transfers Sit to Stand: Max assist;+2 physical assistance Stand pivot transfers: Max assist;+2 physical assistance            Balance Overall balance assessment: Needs assistance Sitting-balance support: Feet supported Sitting balance-Leahy Scale: Poor   Postural control: Left lateral lean   Standing balance-Leahy Scale: Zero Standing balance comment: Max assist for balance in standing.                   ADL Overall ADL's : Needs assistance/impaired     Grooming: Moderate assistance;Sitting Grooming Details (indicate cue type and reason): Pt  able to wash R hand with sanitizer. Able to put deoderant under L arm with support for raising L UE. Assist provided for deoderant under R arm. Upper Body Bathing: Moderate assistance;Sitting Upper Body Bathing Details (indicate cue type and reason): Pt able to use RUE to wash UB with washcloth while sitting on BSC. Lower Body Bathing: Maximal assistance;Sitting/lateral leans;Sit to/from stand Lower Body Bathing Details (indicate cue type and reason): Pt assisting with washing peri area sitting on BSC. Assist provided in standing for cleaning. Upper Body Dressing : Moderate assistance;Sitting Upper Body Dressing Details (indicate cue type and reason): Assist for L arm while donning/doffing hospital gown.     Toilet Transfer: Maximal assistance;+2 for physical assistance;Stand-pivot;BSC   Toileting- Clothing Manipulation and Hygiene: Maximal assistance;Sit to/from stand Toileting - Clothing Manipulation Details (indicate cue type and reason): Pt assisting with peri care in sitting     Functional mobility during ADLs: Maximal assistance;+2 for physical assistance General ADL Comments: Pt with increased head control with verbal cues. Able to look up and maintain neutral alignment for a few seconds. Pt able to scan tray to L side to find phone with verbal cues.      Vision                     Perception     Praxis      Cognition   Behavior During Therapy:  (Tearful) Overall Cognitive Status: No family/caregiver present to determine baseline cognitive functioning Area of Impairment: Attention;Following commands;Safety/judgement;Awareness;Problem solving   Current Attention  Level: Sustained    Following Commands: Follows one step commands consistently;Follows one step commands with increased time Safety/Judgement: Decreased awareness of safety Awareness: Intellectual Problem Solving: Slow processing;Decreased initiation;Requires verbal cues;Requires tactile cues General  Comments: Pt alert at start of session with increased lethargy/fatigue toward end of session. Pt reports RN "just gave her something"    Extremity/Trunk Assessment               Exercises     Shoulder Instructions       General Comments      Pertinent Vitals/ Pain       Pain Assessment: Faces Faces Pain Scale: Hurts little more Pain Location: head Pain Descriptors / Indicators: Guarding Pain Intervention(s): Monitored during session;Repositioned  Home Living                                          Prior Functioning/Environment              Frequency Min 3X/week     Progress Toward Goals  OT Goals(current goals can now be found in the care plan section)  Progress towards OT goals: Progressing toward goals  Acute Rehab OT Goals Patient Stated Goal: none stated OT Goal Formulation: With patient  Plan Discharge plan needs to be updated    Co-evaluation    PT/OT/SLP Co-Evaluation/Treatment: Yes Reason for Co-Treatment: Complexity of the patient's impairments (multi-system involvement);For patient/therapist safety   OT goals addressed during session: ADL's and self-care      End of Session     Activity Tolerance Patient tolerated treatment well   Patient Left in chair;with call bell/phone within reach;with chair alarm set   Nurse Communication          Time: [id number] OT Time Calculation (min): 25 min  Charges: OT General Charges $OT Visit: 1 Procedure OT Treatments $Self Care/Home Management : 8-22 mins  Binnie Kand M.S., OTR/L Pager: [phone]  09/10/2015, 3:56 PM

## 2015-09-10 NOTE — Progress Notes (Signed)
Speech Language Pathology Treatment: Dysphagia  Patient Details Name: Jo Johnston MRN: [id number] DOB: [birth year] Today's Date: 09/10/2015 Time: 1400-1420 SLP Time Calculation (min) (ACUTE ONLY): 20 min  Assessment / Plan / Recommendation Clinical Impression  RN tech reported pt coughing frequently during lunch, impulsive and stopped meal. Delayed cough and wet vocal quality during SLP's observation of solid and thin liquid (upgraded yesterday) with max cues to swallow prior to subsequent bites/sips. Given clinical observation and cognitive impairments, SLP downgrading liquids back to nectar, continue Dys 3 and full supervision (tactile cues when needed). Continue therapy.   HPI HPI: Pt is a 50 y.o. female with PMH of HTN, DM, Asthma, and bipolar disorder, admitted 7/13 after being found unresponsive. On arrival pt was nonverbal and with R eye deviation. 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 Pt underwent bilateral frontal parietal craniotomy with resection of tumor. Pt was intubated 7/13 to 7/15. Reportedly pt is answering some simple questions but speech is "moderately garbled". Bedside swallow eval ordered to assess swallow function given current cognitive status and post-extubation.       SLP Plan  Continue with current plan of care     Recommendations  Diet recommendations: Dysphagia 3 (mechanical soft);Nectar-thick liquid Liquids provided via: Cup;Straw Medication Administration: Whole meds with puree Supervision: Staff to assist with self feeding;Full supervision/cueing for compensatory strategies Compensations: Slow rate;Small sips/bites;Lingual sweep for clearance of pocketing;Monitor for anterior loss Postural Changes and/or Swallow Maneuvers: Seated upright 90 degrees             Oral Care Recommendations: Oral care BID Follow up Recommendations: Skilled Nursing facility Plan: Continue with current plan of care                      [name] 09/10/2015, 3:09 PM   [name].Ed [employer] [phone]

## 2015-09-10 NOTE — Progress Notes (Signed)
CSW spoke with pt sister- she has huge amounts of anxiety about pt transferring without being present- will be coming back to hospital tomorrow  Pt has bed at [facility] SNF- they can accept patient tomorrow- sister informed and agreeable to transfer to SNF tomorrow- still expressing concerns about pt withdrawaling from xanax- states she will have psych follow up at facility  CSW will continue to follow- per neuro note still awaiting pathology results but pt should be stable for transfer to SNF tomorrow  Domenica Reamer, [place] Worker [phone]

## 2015-09-11 LAB — GLUCOSE, CAPILLARY
GLUCOSE-CAPILLARY: 123 mg/dL — AB (ref 65–99)
GLUCOSE-CAPILLARY: 124 mg/dL — AB (ref 65–99)
GLUCOSE-CAPILLARY: 125 mg/dL — AB (ref 65–99)
GLUCOSE-CAPILLARY: 133 mg/dL — AB (ref 65–99)
Glucose-Capillary: 114 mg/dL — ABNORMAL HIGH (ref 65–99)
Glucose-Capillary: 138 mg/dL — ABNORMAL HIGH (ref 65–99)
Glucose-Capillary: 146 mg/dL — ABNORMAL HIGH (ref 65–99)

## 2015-09-11 MED ORDER — CHLORHEXIDINE GLUCONATE 0.12 % MT SOLN
OROMUCOSAL | Status: AC
  Filled 2015-09-11: qty 15

## 2015-09-11 NOTE — Progress Notes (Signed)
No acute events. Still c/o headache. Emotionally labile. Left-sided hemiparesis unchanged. Wound looks good. Dispo planning.

## 2015-09-11 NOTE — Progress Notes (Signed)
Speech Language Pathology Treatment: Dysphagia  Patient Details Name: Jo Johnston MRN: [id number] DOB: 1965-08-03 Today's Date: 09/11/2015 Time: [id number] SLP Time Calculation (min) (ACUTE ONLY): 15 min  Assessment / Plan / Recommendation Clinical Impression  Pt with better toleration of modified diet, thickened liquids with mod assist needed for attention and pacing, but no overt s/s of aspiration.  Pt emotional, asking repeatedly to go home, and impulsive with self-feeding/poor self-regulation.  Continue current diet; pt would benefit from cognitive evaluation.     HPI HPI: Pt is a 50 y.o. female with PMH of HTN, DM, Asthma, and bipolar disorder, admitted 7/13 after being found unresponsive. On arrival pt was nonverbal and with R eye deviation. 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 Pt underwent bilateral frontal parietal craniotomy with resection of tumor. Pt was intubated 7/13 to 7/15. Reportedly pt is answering some simple questions but speech is "moderately garbled". Bedside swallow eval ordered to assess swallow function given current cognitive status and post-extubation.       SLP Plan  Continue with current plan of care     Recommendations  Diet recommendations: Dysphagia 3 (mechanical soft);Nectar-thick liquid Liquids provided via: Cup;Straw Medication Administration: Whole meds with puree Supervision: Staff to assist with self feeding;Full supervision/cueing for compensatory strategies Compensations: Slow rate;Small sips/bites;Lingual sweep for clearance of pocketing;Monitor for anterior loss Postural Changes and/or Swallow Maneuvers: Seated upright 90 degrees             Oral Care Recommendations: Oral care BID Plan: Continue with current plan of care     GO                Jo Johnston 09/11/2015, 2:30 PM

## 2015-09-12 LAB — GLUCOSE, CAPILLARY
GLUCOSE-CAPILLARY: 121 mg/dL — AB (ref 65–99)
GLUCOSE-CAPILLARY: 161 mg/dL — AB (ref 65–99)
GLUCOSE-CAPILLARY: 152 mg/dL — AB (ref 65–99)
Glucose-Capillary: 147 mg/dL — ABNORMAL HIGH (ref 65–99)
Glucose-Capillary: 172 mg/dL — ABNORMAL HIGH (ref 65–99)
Glucose-Capillary: 150 mg/dL — ABNORMAL HIGH (ref 65–99)

## 2015-09-12 MED ORDER — NICOTINE 21 MG/24HR TD PT24
21.0000 mg | MEDICATED_PATCH | Freq: Every day | TRANSDERMAL | Status: DC
  Administered 2015-09-12 – 2015-09-13 (×2): 21 mg via TRANSDERMAL
  Filled 2015-09-12 (×2): qty 1

## 2015-09-12 NOTE — Clinical Social Work Note (Signed)
CSW attempting to reach Dr. Cyndy Freeze re: possible stability for d/c to SNF at [facility].  Currently no d/c order in place. SW services will continue to monitor and assist with d/c when stable.  Lorie Phenix. Pauline Good, [place]

## 2015-09-12 NOTE — Progress Notes (Signed)
No acute events Remains emotionally labile Complaining of pain Awake and alert Unchanged left hemiparesis Wound looks good Dispo planning

## 2015-09-13 ENCOUNTER — Encounter (HOSPITAL_COMMUNITY): Payer: Self-pay

## 2015-09-13 LAB — GLUCOSE, CAPILLARY
GLUCOSE-CAPILLARY: 110 mg/dL — AB (ref 65–99)
GLUCOSE-CAPILLARY: 202 mg/dL — AB (ref 65–99)
Glucose-Capillary: 143 mg/dL — ABNORMAL HIGH (ref 65–99)
Glucose-Capillary: 110 mg/dL — ABNORMAL HIGH (ref 65–99)

## 2015-09-13 MED ORDER — DEXAMETHASONE SODIUM PHOSPHATE 4 MG/ML IJ SOLN: 2.0000 mg | Freq: Two times a day (BID) | INTRAMUSCULAR | 4 refills | Status: AC

## 2015-09-13 MED ORDER — ALPRAZOLAM 2 MG PO TABS: 2.0000 mg | ORAL_TABLET | Freq: Two times a day (BID) | ORAL | 0 refills | Status: AC

## 2015-09-13 MED ORDER — LEVETIRACETAM 500 MG PO TABS: 500.0000 mg | ORAL_TABLET | Freq: Two times a day (BID) | ORAL | 2 refills | Status: AC

## 2015-09-13 NOTE — Progress Notes (Signed)
Patient will DC to: [facility] Anticipated DC date: 09/13/15 Family notified: Son, Sister Transport by: Corey Harold   Per MD patient ready for DC to [facility]. RN, patient, patient's family, and facility notified of DC. RN given number for report. DC packet on chart. Ambulance transport requested for patient.   CSW signing off.  Cedric Fishman, [place] Social Worker [phone]

## 2015-09-13 NOTE — Discharge Summary (Signed)
  Physician Discharge Summary  Patient ID: Jo Johnston MRN: [id number] DOB/AGE: 07-24-1965 50 y.o.  Admit date: 09/02/2015 Discharge date: 09/13/2015  Admission Diagnoses:  Discharge Diagnoses:  Active Problems:   ICH (intracerebral hemorrhage) (HCC)   Nontraumatic cortical hemorrhage of right cerebral hemisphere [facility])   Status post craniotomy   Hyperglycemia   Benign essential HTN   Bipolar disorder, in partial remission, most recent episode mixed (HCC)   Cognitive and behavioral changes   Dysphagia as late effect of cerebrovascular disease   Hypokalemia   Leukocytosis   Malignant neoplasm of frontal lobe [facility])   Discharged Condition: fair  Hospital Course: Patient admitted to the hospital for treatment of a large right frontal intracerebral hematoma with associated tumor. Patient underwent craniotomy and resection of tumor. Pathology remains pending at this time area did patient has made significant recovery after surgery. She is now awake and talking. She is emotional and there is some overlay with her baseline psychiatric condition. She has a significant left-sided hammock Paris cyst which is slowly improving with therapy. She has no wound issues. She is ready for discharge to a skilled nursing facility for further convalescence.  Consults:   Significant Diagnostic Studies:   Treatments:   Discharge Exam: Blood pressure 110/75, pulse 76, temperature 97.9 F (36.6 C), temperature source Oral, resp. rate 20, height 5\' 1"  (1.549 m), weight 52.4 kg (115 lb 8.3 oz), SpO2 99 %. Patient is awake and alert. She is tearful and emotional. Her speech is fluent. She follows commands. Examination of her cranial nerves finds her to have mild left facial weakness otherwise cranial nerve function appears normal. She has a dense left paresis of her upper extremity. Right upper extremity strength normal. She has mild weakness in her left lower extremity. Right lower extremity strength  normal. Wound is well-healed. Chest and abdomen are benign.  Disposition: 01-Home or Self Care     Medication List    TAKE these medications   alprazolam 2 MG tablet Commonly known as:  XANAX Take 1 tablet (2 mg total) by mouth 2 (two) times daily.   ARIPiprazole 20 MG tablet Commonly known as:  ABILIFY Take 20 mg by mouth daily.   dexamethasone 4 MG/ML injection Commonly known as:  DECADRON Inject 0.5 mLs (2 mg total) into the vein every 12 (twelve) hours.   ibuprofen 200 MG tablet Commonly known as:  ADVIL,MOTRIN Take 200 mg by mouth every 6 (six) hours as needed for moderate pain.   levETIRAcetam 500 MG tablet Commonly known as:  KEPPRA Take 1 tablet (500 mg total) by mouth every 12 (twelve) hours.   traZODone 150 MG tablet Commonly known as:  DESYREL Take 150 mg by mouth at bedtime.        Signed: Tyrel Lex A 09/13/2015, 2:30 PM

## 2015-09-13 NOTE — Clinical Social Work Placement (Signed)
   CLINICAL SOCIAL WORK PLACEMENT  NOTE  Date:  09/13/2015  Patient Details  Name: Jo Johnston MRN: [id number] Date of Birth: 1965/08/15  Clinical Social Work is seeking post-discharge placement for this patient at the [place] level of care (*CSW will initial, date and re-position this form in  chart as items are completed):  Yes   Patient/family provided with [place] Work Department's list of facilities offering this level of care within the geographic area requested by the patient (or if unable, by the patient's family).  Yes   Patient/family informed of their freedom to choose among providers that offer the needed level of care, that participate in Medicare, Medicaid or managed care program needed by the patient, have an available bed and are willing to accept the patient.  Yes   Patient/family informed of [facility] ownership interest in [facility] and [facility], as well as of the fact that they are under no obligation to receive care at these facilities.  PASRR submitted to [employer] on 09/09/15     PASRR number received on 09/09/15     Existing PASRR number confirmed on       FL2 transmitted to all facilities in geographic area requested by pt/family on 09/09/15     FL2 transmitted to all facilities within larger geographic area on       Patient informed that his/her managed care company has contracts with or will negotiate with certain facilities, including the following:        Yes   Patient/family informed of bed offers received.  Patient chooses bed at [facility]     Physician recommends and patient chooses bed at      Patient to be transferred to [facility] on 09/13/15.  Patient to be transferred to facility by [facility]     Patient family notified on 09/13/15 of transfer.  Name of family member notified:  Son      PHYSICIAN Please sign FL2     Additional Comment:     _______________________________________________ Benard Halsted, [place] 09/13/2015, 3:03 PM

## 2015-09-13 NOTE — Progress Notes (Signed)
Pt transferred to [facility] per orders from MD. RN contacted [facility] and gave report. Pt aware of transfer. Pt verbalized understanding of transfer. Pt's IV was removed before discharge.

## 2015-09-21 ENCOUNTER — Encounter (HOSPITAL_COMMUNITY): Payer: Self-pay

## 2015-09-21 ENCOUNTER — Ambulatory Visit: Payer: Medicaid Other

## 2015-09-28 ENCOUNTER — Encounter: Payer: Self-pay | Admitting: *Deleted

## 2015-09-29 ENCOUNTER — Encounter: Admission: RE | Payer: Self-pay | Source: Ambulatory Visit

## 2015-09-29 ENCOUNTER — Ambulatory Visit
Admission: RE | Admit: 2015-09-29 | Payer: Medicaid Other | Source: Ambulatory Visit | Admitting: Unknown Physician Specialty

## 2015-09-29 SURGERY — COLONOSCOPY WITH PROPOFOL
Anesthesia: General

## 2016-06-08 IMAGING — CR DG SHOULDER 2+V*L*
1 series · 4 of 4 positions shown · non-contrast
Comparison: None.

CLINICAL DATA: Left shoulder pain, awakened this morning.

EXAM:
LEFT SHOULDER - 2+ VIEW

[Series 1: dg shoulder left · 0.14mm/px · 4 of 4 slices shown]
[im 1/4]
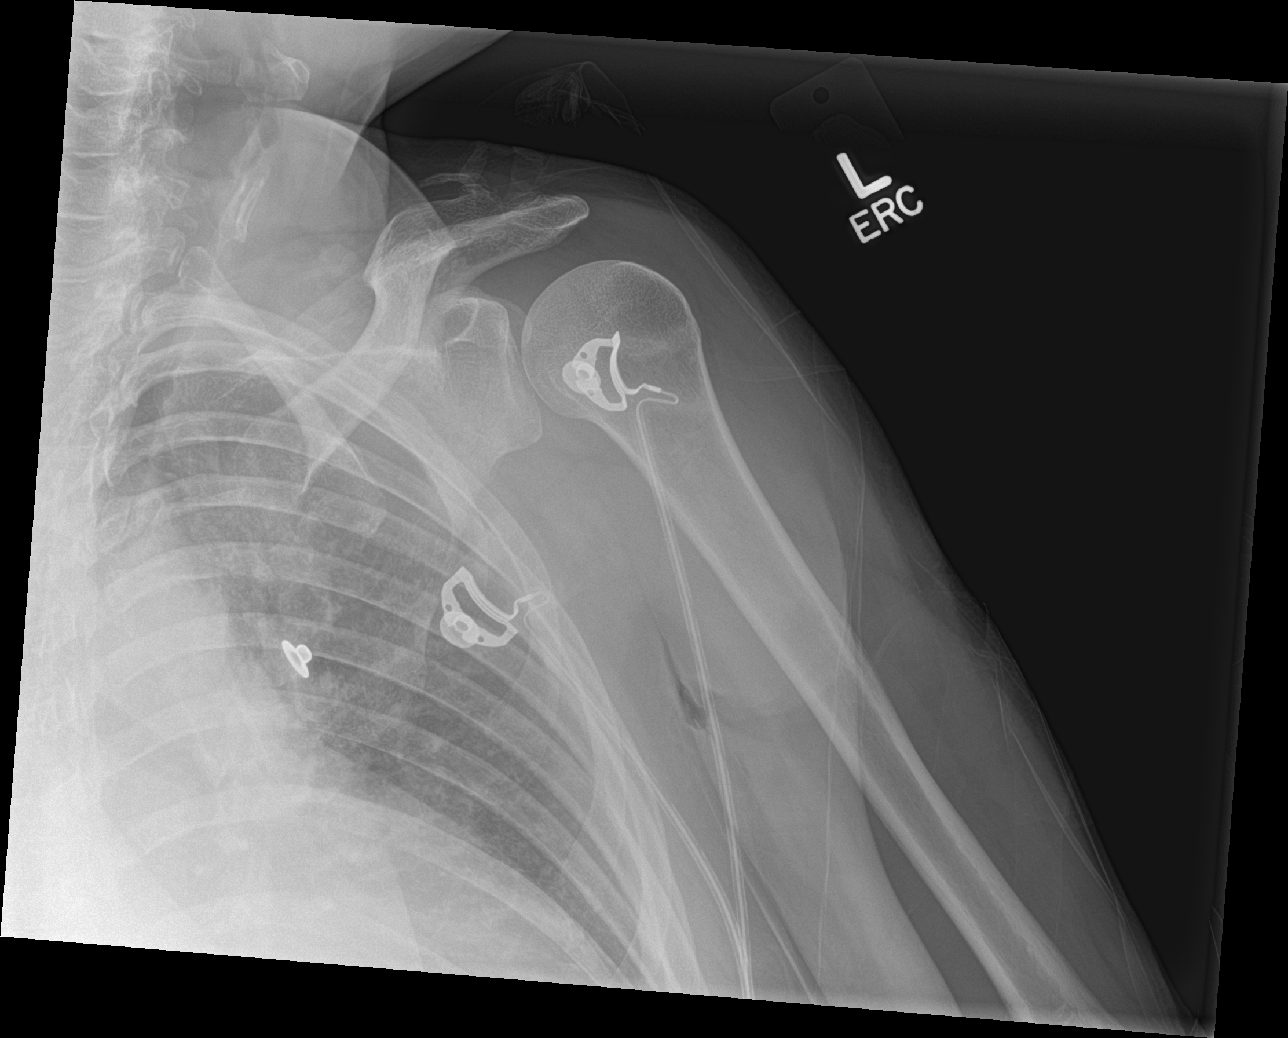
[im 2/4]
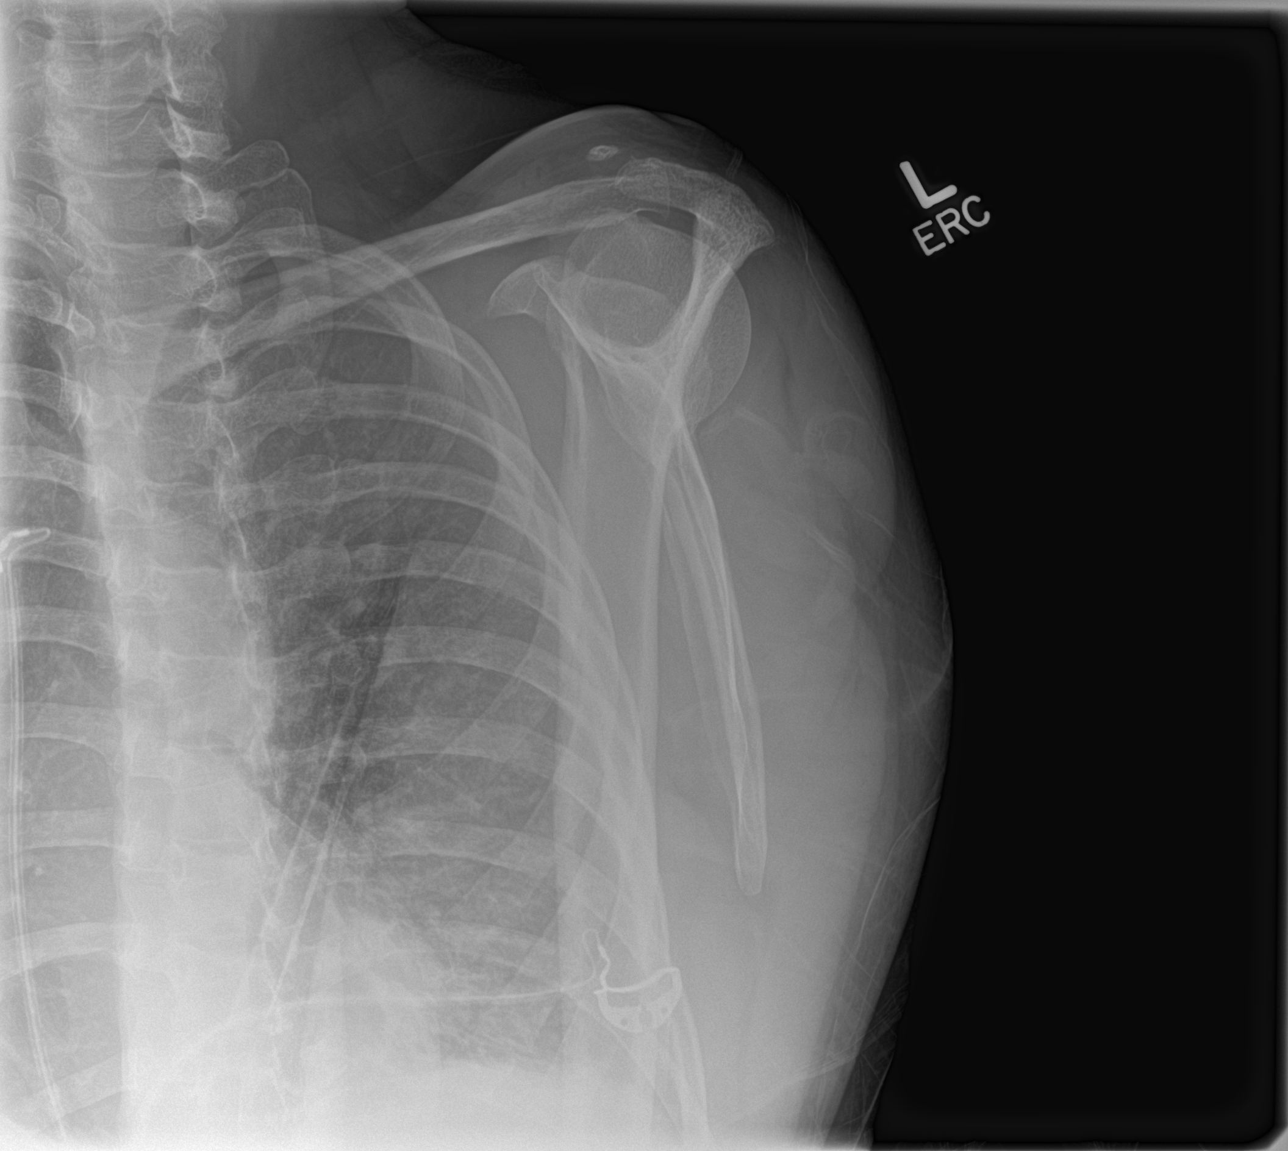
[im 3/4]
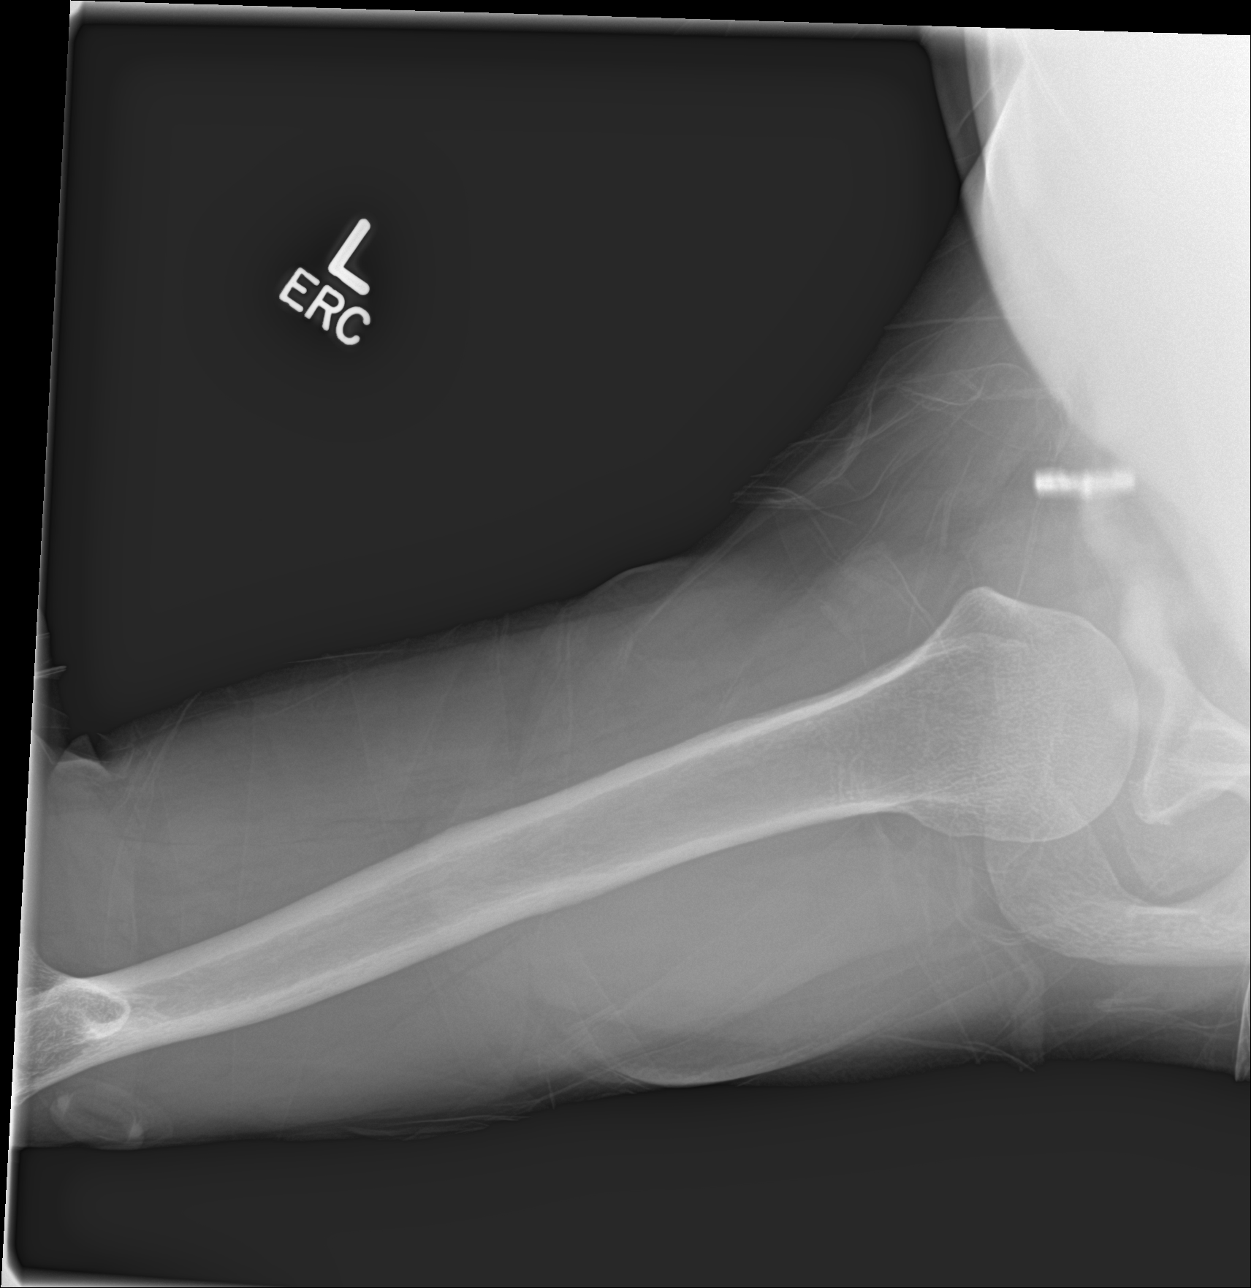
[im 4/4]
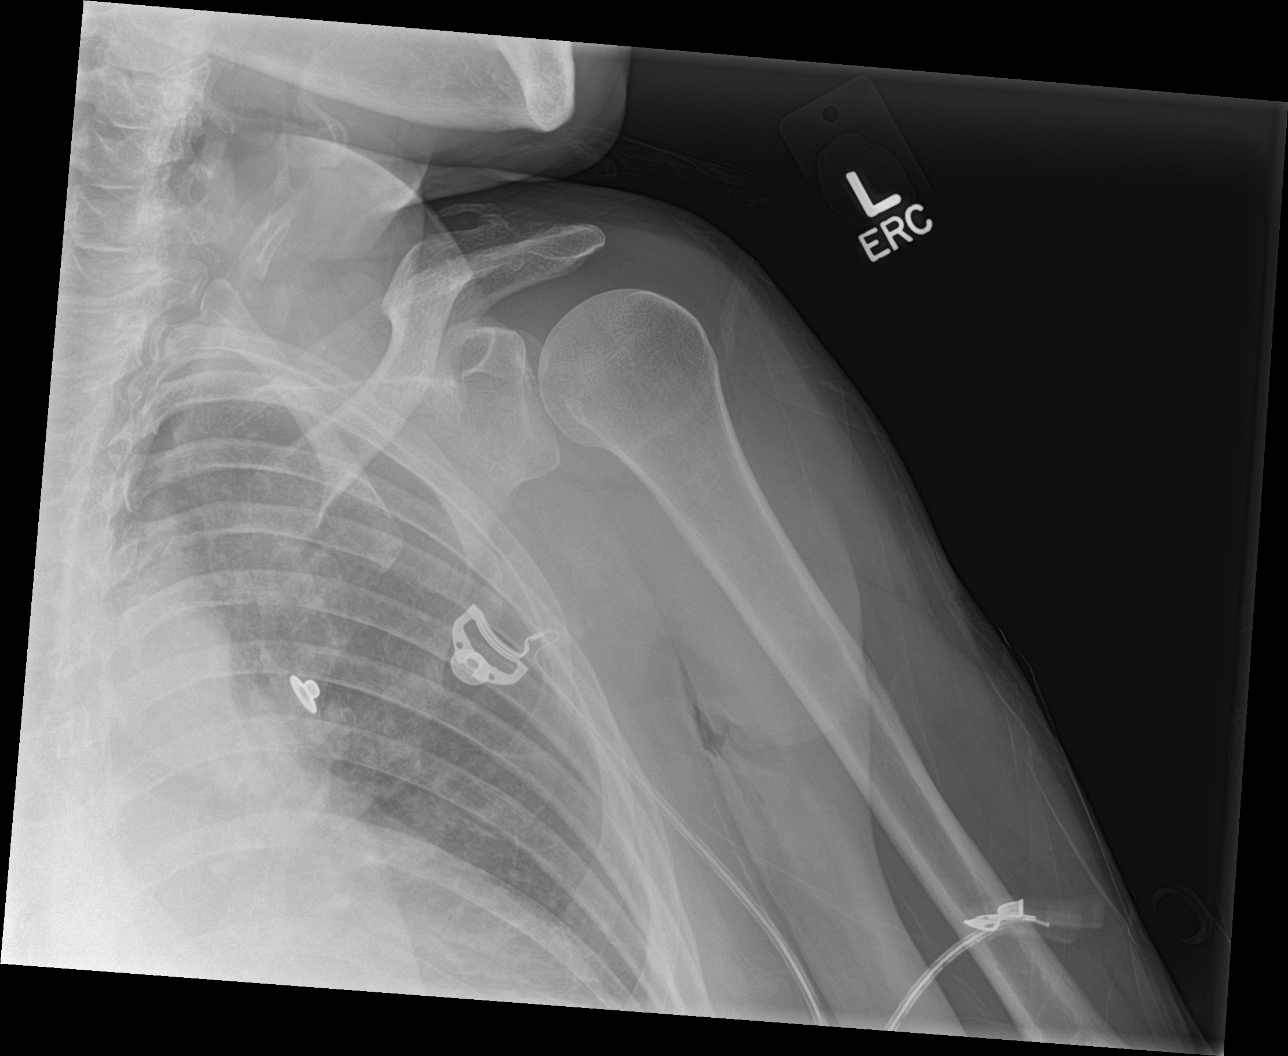

[4 of 4 positions shown; findings below may reference images not displayed]

FINDINGS: Negative for acute fracture dislocation. There is no bone lesion or
bony destruction. There are prominent arthritic changes at the AC
joint. The glenohumeral articulation is much better preserved.
IMPRESSION: AC arthritis.  No acute findings are evident.

## 2016-10-07 IMAGING — CT CT HEAD W/O CM
4 series · 15 of 47 positions shown, 17 images · non-contrast
Comparison: 09/04/2015.  09/02/2015.

CLINICAL DATA: Followup craniotomy for right frontal
intraparenchymal hematoma.

EXAM:
CT HEAD WITHOUT CONTRAST
TECHNIQUE: Contiguous axial images were obtained from the base of the skull
through the vertex without intravenous contrast.

[Series 2: head without · axial · non-contrast · 0.42mm/px · z∈[-77,+43]mm · 7 of 32 slices shown, 9 images]
[im 4/32  brain]
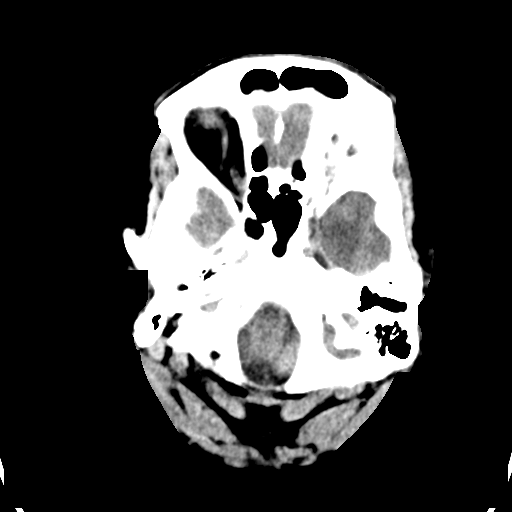
[im 4/32  bone]
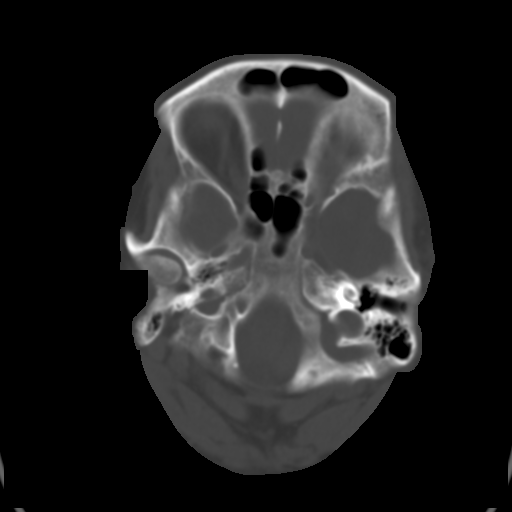
[im 8/32  brain]
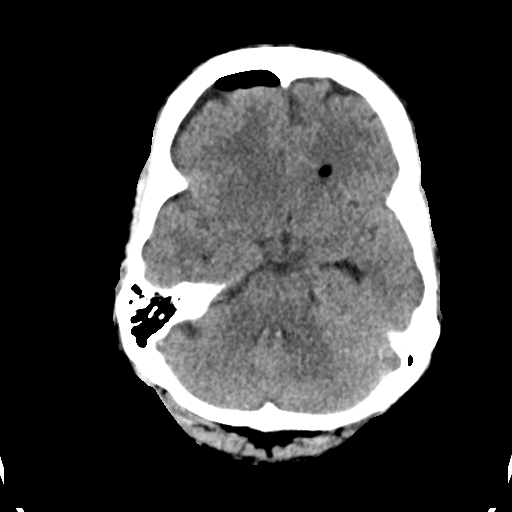
[im 12/32  brain]
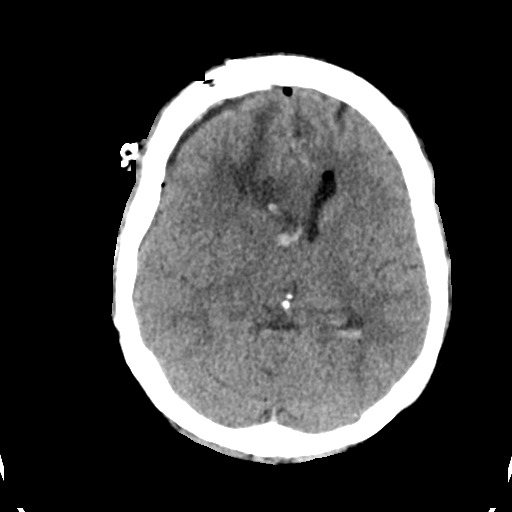
[im 16/32  brain]
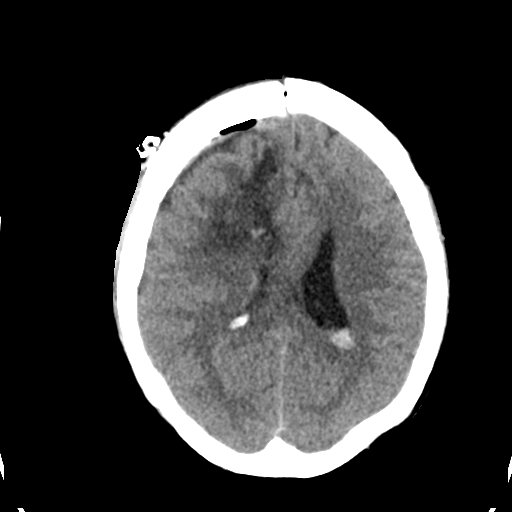
[im 20/32  brain]
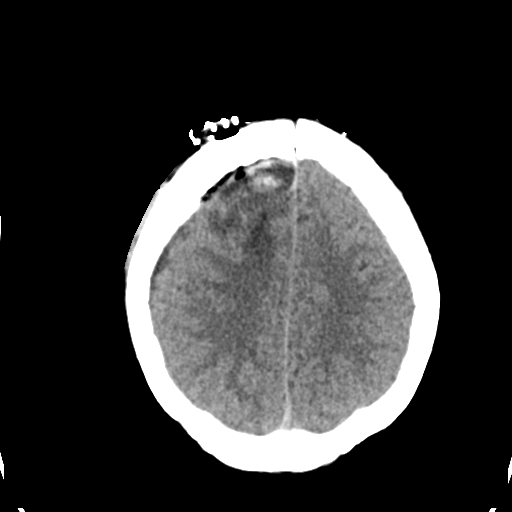
[im 20/32  bone]
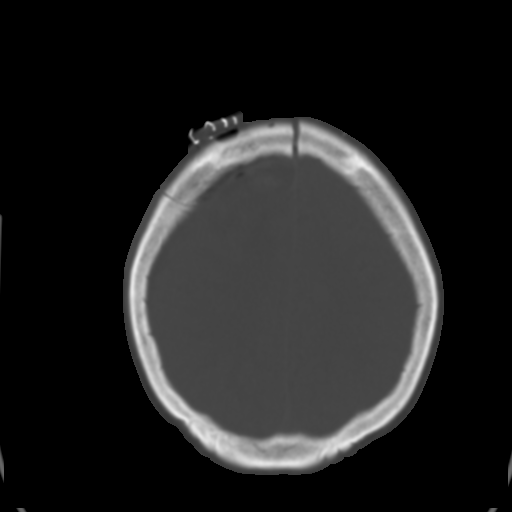
[im 24/32  brain]
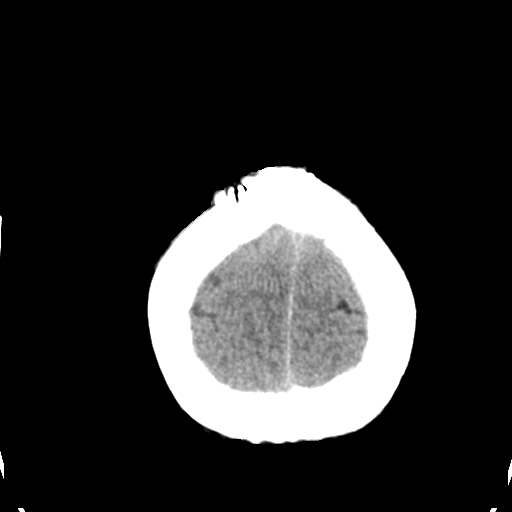
[im 28/32  brain]
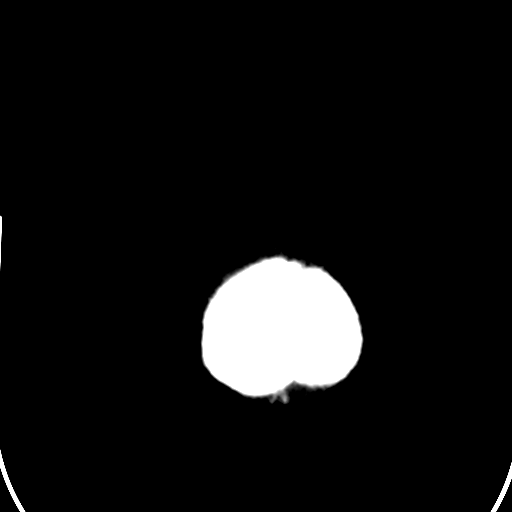

[Series 3: head bone · axial · 0.42mm/px · z∈[-78,-62]mm · 2 of 79 slices shown]
[im 8/79  bone]
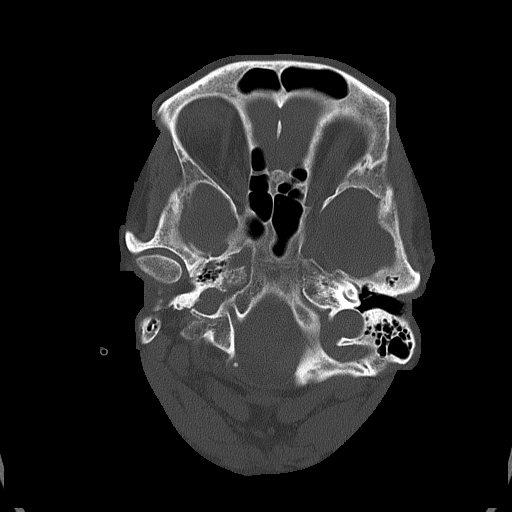
[im 16/79  bone]
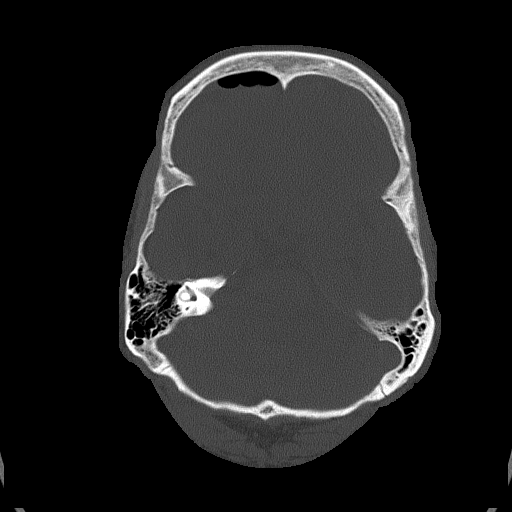

[Series 4: head without cor · coronal · non-contrast · 0.30mm/px · 3 of 68 slices shown]
[im 23/68  brain]
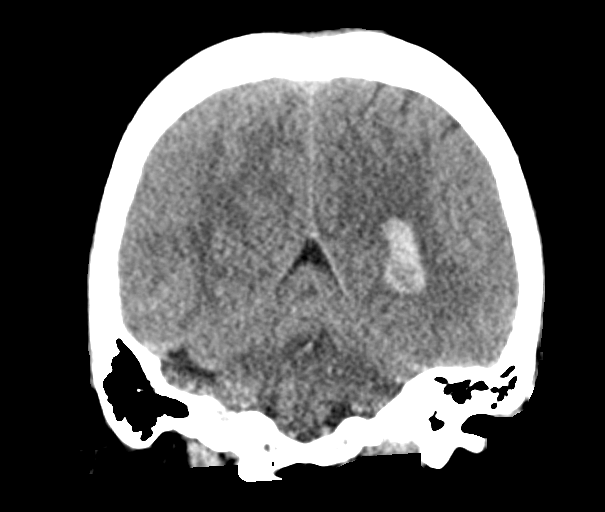
[im 30/68  brain]
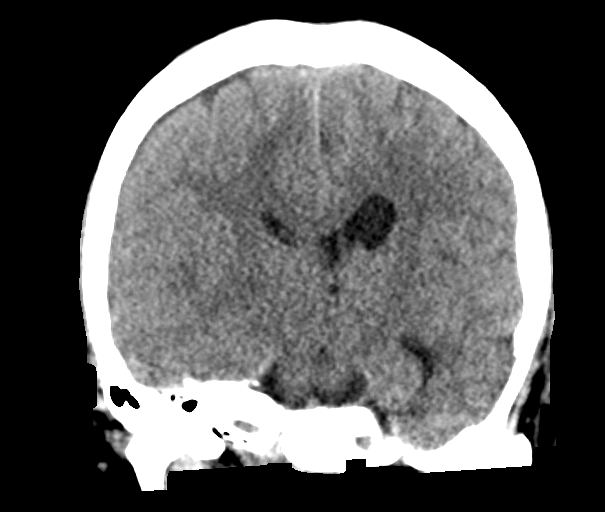
[im 38/68  brain]
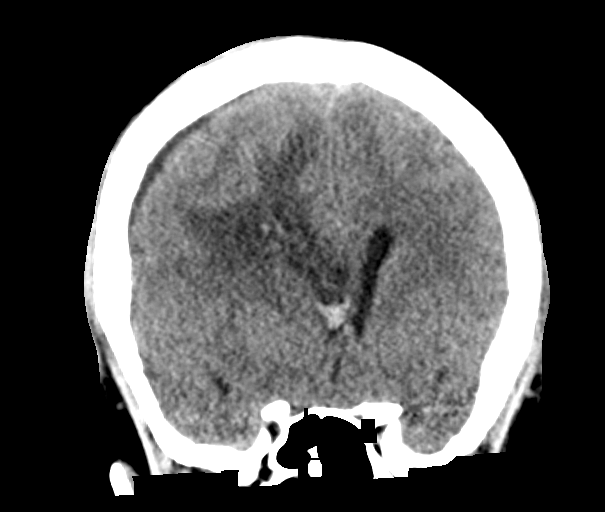

[Series 5: head without sag · sagittal · non-contrast · 0.31mm/px · 3 of 66 slices shown]
[im 22/66  brain]
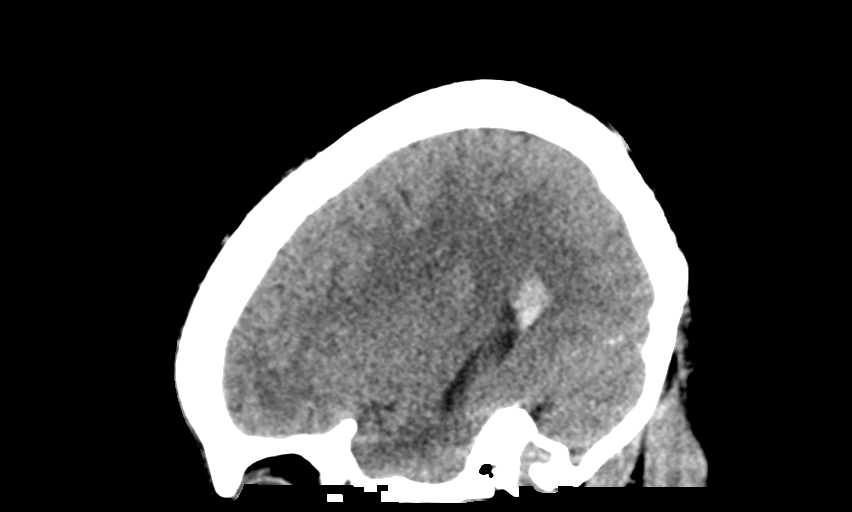
[im 33/66  brain]
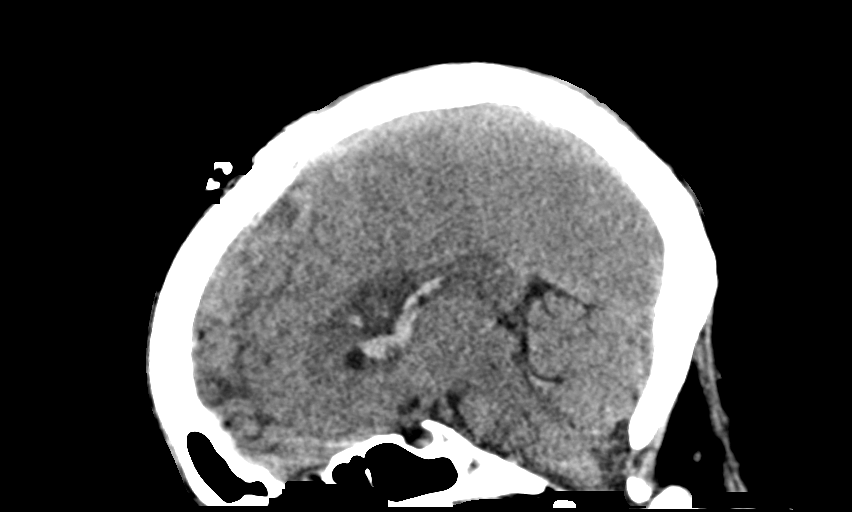
[im 44/66  brain]
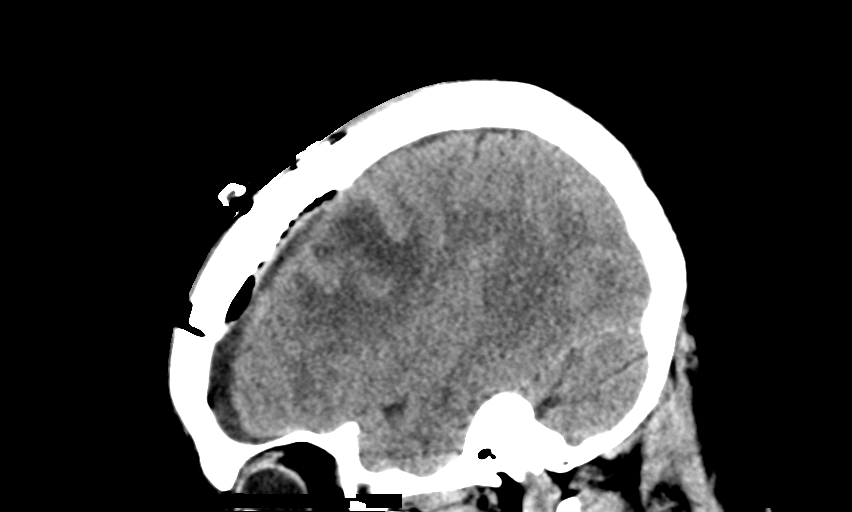

[15 of 47 positions shown; findings below may reference images not displayed]

FINDINGS: Previous right frontal craniotomy for evacuation of large right
frontal intraparenchymal hematoma. There is only a small amount of
hyperdense blood in this region presently, with no evidence of
rebleeding since the surgery. Small amount of fluid and air in the
subdural space on the right is unchanged, maximal thickness 5 mm.
Intraventricular blood layering dependently is unchanged. Clot in
the frontal horn of the right lateral ventricle, or possibly
subependymal, is unchanged. Ventricular size is stable.
Right-to-left midline shift of 8.4 mm is unchanged. No new or
worsening finding.
IMPRESSION: Fairly stable exam since yesterday. No evidence of rebleeding in the
right frontal lobe. Edema and mass effect are approximately the same
with right-to-left shift of 8.4 mm. Thin amount of subdural fluid
and air on the right is unchanged. Intraventricular blood and
ventricular size are unchanged.
